# Patient Record
Sex: Male | Born: 1960 | Race: White | Hispanic: No | Marital: Married | Smoking: Never smoker
Health system: Southern US, Community
[De-identification: ages and names within clinical notes are randomized; demographics above are authoritative.]

## PROBLEM LIST (undated history)

## (undated) DIAGNOSIS — N400 Enlarged prostate without lower urinary tract symptoms: Secondary | ICD-10-CM

## (undated) HISTORY — PX: TONSILLECTOMY: SUR1361

## (undated) HISTORY — DX: Benign prostatic hyperplasia without lower urinary tract symptoms: N40.0

---

## 2007-06-03 ENCOUNTER — Ambulatory Visit: Payer: Self-pay | Admitting: Family Medicine

## 2009-10-09 ENCOUNTER — Ambulatory Visit: Payer: Self-pay | Admitting: General Practice

## 2011-06-24 ENCOUNTER — Ambulatory Visit: Payer: Self-pay | Admitting: General Practice

## 2013-12-30 ENCOUNTER — Ambulatory Visit: Payer: Self-pay | Admitting: Gastroenterology

## 2014-01-02 LAB — PATHOLOGY REPORT

## 2015-10-24 ENCOUNTER — Ambulatory Visit: Payer: Self-pay | Admitting: Physician Assistant

## 2015-10-24 VITALS — BP 110/60 | HR 97 | Temp 98.3°F

## 2015-10-24 DIAGNOSIS — H6593 Unspecified nonsuppurative otitis media, bilateral: Secondary | ICD-10-CM

## 2015-10-24 DIAGNOSIS — J069 Acute upper respiratory infection, unspecified: Secondary | ICD-10-CM

## 2015-10-24 MED ORDER — SALINE SPRAY 0.65 % NA SOLN: 2.0000 | NASAL | Status: DC | PRN

## 2015-10-24 MED ORDER — FLUTICASONE PROPIONATE 50 MCG/ACT NA SUSP: 2.0000 | Freq: Every day | NASAL | Status: DC

## 2015-10-24 MED ORDER — PSEUDOEPHEDRINE HCL 60 MG PO TABS: 60.0000 mg | ORAL_TABLET | Freq: Four times a day (QID) | ORAL | Status: AC | PRN

## 2015-10-24 MED ORDER — AMOXICILLIN-POT CLAVULANATE 875-125 MG PO TABS: 1.0000 | ORAL_TABLET | Freq: Two times a day (BID) | ORAL | Status: AC

## 2015-10-24 NOTE — Progress Notes (Signed)
Subjective:    Patient ID: Billy Foster, male    DOB: [birth year], 55 y.o.   MRN: [id number]  HPI Comments: Married caucasian male here for evaluation of sore throat, cough, fatigue started this am.  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 The current episode started today. The problem has been gradually worsening. There has been no fever. The fever has been present for less than 1 day. Associated symptoms include congestion, coughing, rhinorrhea and a sore throat. Pertinent negatives include no abdominal pain, chest pain, diarrhea, dysuria, ear pain, headaches, joint pain, joint swelling, nausea, neck pain, plugged ear sensation, rash, sinus pain, sneezing, swollen glands, vomiting or wheezing. He has tried nothing for the symptoms.  Sore Throat  Associated symptoms include congestion and coughing. Pertinent negatives include no abdominal pain, diarrhea, drooling, ear discharge, ear pain, headaches, plugged ear sensation, neck pain, shortness of breath, stridor, swollen glands, trouble swallowing or vomiting.      Review of Systems  Constitutional: Positive for fatigue. Negative for fever, chills, diaphoresis, activity change, appetite change and unexpected weight change.  HENT: Positive for congestion, postnasal drip, rhinorrhea and sore throat. Negative for dental problem, drooling, ear discharge, ear pain, facial swelling, hearing loss, mouth sores, nosebleeds, sinus pressure, sneezing, tinnitus, trouble swallowing and voice change.   Eyes: Negative for photophobia, pain, discharge, redness, itching and visual disturbance.  Respiratory: Positive for cough. Negative for choking, chest tightness, shortness of breath, wheezing and stridor.   Cardiovascular: Negative for chest pain, palpitations and leg swelling.  Gastrointestinal:  Negative for nausea, vomiting, abdominal pain, diarrhea, constipation, blood in stool and abdominal distention.  Endocrine: Negative for cold intolerance and heat intolerance.  Genitourinary: Negative for dysuria.  Musculoskeletal: Positive for myalgias. Negative for back pain, joint pain, joint swelling, arthralgias, gait problem, neck pain and neck stiffness.  Skin: Negative for color change, pallor, rash and wound.  Allergic/Immunologic: Negative for environmental allergies, food allergies and immunocompromised state.  Neurological: Negative for dizziness, tremors, seizures, syncope, facial asymmetry, speech difficulty, weakness, light-headedness, numbness and headaches.  Hematological: Negative for adenopathy. Does not bruise/bleed easily.  Psychiatric/Behavioral: Negative for behavioral problems, confusion, sleep disturbance and agitation.       Objective:   Physical Exam  Constitutional: He is oriented to person, place, and time. Vital signs are normal. He appears well-developed and well-nourished. He is active and cooperative.  Non-toxic appearance. He does not have a sickly appearance. He appears ill. No distress.  HENT:  Head: Normocephalic and atraumatic.  Right Ear: Hearing, external ear and ear canal normal. A middle ear effusion is present.  Left Ear: Hearing, external ear and ear canal normal. A middle ear effusion is present.  Nose: Mucosal edema and rhinorrhea present. No nose lacerations, sinus tenderness, nasal deformity, septal deviation or nasal septal hematoma. No epistaxis.  No foreign bodies. Right sinus exhibits no maxillary sinus tenderness and no frontal sinus tenderness. Left sinus exhibits no maxillary sinus tenderness and no frontal sinus tenderness.  Mouth/Throat: Uvula is midline and mucous membranes are normal. Mucous membranes are not pale, not dry and not cyanotic. He does not have dentures. No oral lesions. No trismus in the jaw. Normal dentition. No dental  abscesses, uvula swelling, lacerations or dental caries. Posterior oropharyngeal edema and posterior oropharyngeal erythema present. No oropharyngeal  exudate or tonsillar abscesses.  Cobblestoning posterior pharynx; bilateral TMs with air fluid level clear; bilateral nasal turbinates with edema/erythema/clear discharge, nasal congestion  Eyes: Conjunctivae, EOM and lids are normal. Pupils are equal, round, and reactive to light. Right eye exhibits no chemosis, no discharge, no exudate and no hordeolum. No foreign body present in the right eye. Left eye exhibits no chemosis, no discharge, no exudate and no hordeolum. No foreign body present in the left eye. Right conjunctiva is not injected. Right conjunctiva has no hemorrhage. Left conjunctiva is not injected. Left conjunctiva has no hemorrhage. No scleral icterus. Right eye exhibits normal extraocular motion and no nystagmus. Left eye exhibits normal extraocular motion and no nystagmus. Right pupil is round and reactive. Left pupil is round and reactive. Pupils are equal.  Neck: Trachea normal and normal range of motion. Neck supple. No tracheal tenderness, no spinous process tenderness and no muscular tenderness present. No rigidity. No tracheal deviation, no edema, no erythema and normal range of motion present. No thyroid mass and no thyromegaly present.  Cardiovascular: Normal rate, regular rhythm, S1 normal, S2 normal, normal heart sounds and intact distal pulses.  PMI is not displaced.  Exam reveals no gallop and no friction rub.   No murmur heard. Pulmonary/Chest: Effort normal and breath sounds normal. No stridor. No respiratory distress. He has no decreased breath sounds. He has no wheezes. He has no rhonchi. He has no rales.  Occasional nonproductive cough with deep inspiration in exam room  Abdominal: Soft. He exhibits no distension.  Musculoskeletal: Normal range of motion. He exhibits no edema or tenderness.  Lymphadenopathy:       Head  (right side): No submental, no submandibular, no tonsillar, no preauricular, no posterior auricular and no occipital adenopathy present.       Head (left side): No submental, no submandibular, no tonsillar, no preauricular, no posterior auricular and no occipital adenopathy present.    He has no cervical adenopathy.       Right cervical: No superficial cervical, no deep cervical and no posterior cervical adenopathy present.      Left cervical: No superficial cervical, no deep cervical and no posterior cervical adenopathy present.  Neurological: He is alert and oriented to person, place, and time. He displays no atrophy and no tremor. No cranial nerve deficit or sensory deficit. He exhibits normal muscle tone. He displays no seizure activity. Coordination and gait normal. GCS eye subscore is 4. GCS verbal subscore is 5. GCS motor subscore is 6.  Skin: Skin is warm, dry and intact. No abrasion, no bruising, no burn, no ecchymosis, no laceration, no lesion, no petechiae and no rash noted. He is not diaphoretic. No cyanosis or erythema. No pallor. Nails show no clubbing.  Psychiatric: He has a normal mood and affect. His speech is normal and behavior is normal. Judgment and thought content normal. Cognition and memory are normal.  Nursing note and vitals reviewed.         Assessment & Plan:  A- viral upper respiratory infection acute and otitis media effusion bilaterally P-Supportive treatment.   No evidence of invasive bacterial infection, non toxic and well hydrated.  This is most likely self limiting viral infection.  I do not see where any further testing or imaging is necessary at this time.   I will suggest supportive care, rest, good hygiene and encourage the patient to take adequate fluids.  The patient is to return to clinic or EMERGENCY ROOM if symptoms worsen or change  significantly e.g. ear pain, fever, purulent discharge from ears or bleeding. Patient verbalized agreement and understanding  of treatment plan.    Viral illness: no evidence of invasive bacterial infection, non toxic and well hydrated.  This is most likely self limiting viral infection.  I do not see where any further testing or imaging is necessary at this time.   I will suggest supportive care, rest, good hygiene and encourage the patient to take adequate fluids.  Work excuse 48 hours given to patient.    Sudafed 60mg  po q6h prn max 240mg  per 24 hours; flonase 1 spray each nostril BID prn, nasal saline 1-2 sprays each nostril prn q2h, tylenol 1000mg  po QID prn. OTC cough medicine per manufacturer's instructions, humidifier use at home.  If sinus pressure worsening or eat discharge start augmentin 875mg  po BID x 10 days Rx given.  Discussed honey with lemon and salt water gargles for comfort also.  The patient is to return to clinic or EMERGENCY ROOM if symptoms worsen or change significantly e.g. fever, lethargy, SOB, wheezing.   Patient verbalized agreement and understanding of treatment plan and had no further questions at this time.

## 2015-11-06 ENCOUNTER — Ambulatory Visit: Payer: Self-pay | Admitting: Physician Assistant

## 2015-11-06 ENCOUNTER — Encounter: Payer: Self-pay | Admitting: Physician Assistant

## 2015-11-06 VITALS — BP 130/80 | HR 60 | Temp 98.8°F

## 2015-11-06 DIAGNOSIS — J069 Acute upper respiratory infection, unspecified: Secondary | ICD-10-CM

## 2015-11-06 MED ORDER — AZITHROMYCIN 250 MG PO TABS: ORAL_TABLET | ORAL | Status: DC

## 2015-11-06 MED ORDER — METHYLPREDNISOLONE 4 MG PO TBPK: ORAL_TABLET | ORAL | Status: AC

## 2015-11-06 NOTE — Progress Notes (Signed)
S: C/o continued cough and congestion , chest is sore from coughing, r lower ribs hurt and r shoulder hurts; denies fever, chills; cough is dry and hacking; states he started to feel better then a few days later started coughing and felt really tired, finished augmentin and sudafed; denies cardiac type chest pain or sob, v/d, abd pain Remainder ros neg  O: vitals wnl, nad, tms clear, throat injected, neck supple no lymph, lungs c t a, cv rrr, neuro intact  A:  Acute bronchitis   P:  rx medication:  Zpack, medrol dose pack; use otc meds, tylenol or motrin as needed for fever/chills, return if not better in 3 -5 days, return earlier if worsening

## 2015-12-17 ENCOUNTER — Other Ambulatory Visit: Payer: Self-pay

## 2015-12-17 DIAGNOSIS — Z0000 Encounter for general adult medical examination without abnormal findings: Secondary | ICD-10-CM

## 2015-12-17 NOTE — Progress Notes (Signed)
Patient came in to have blood drawn per Dr. Emily Filbert at [facility].  Blood was drawn from the left arm without any incident. Patient wants results sent to Dr. Sabra Heck when they are finalized.

## 2015-12-18 LAB — CMP12+LP+TP+TSH+6AC+PSA+CBC…
A/G RATIO: 2.1 (ref 1.2–2.2)
ALBUMIN: 4.6 g/dL (ref 3.5–5.5)
ALT: 20 IU/L (ref 0–44)
AST: 21 IU/L (ref 0–40)
Alkaline Phosphatase: 64 IU/L (ref 39–117)
BASOS ABS: 0 10*3/uL (ref 0.0–0.2)
BASOS: 0 %
BUN / CREAT RATIO: 14 (ref 9–20)
BUN: 15 mg/dL (ref 6–24)
Bilirubin Total: 0.6 mg/dL (ref 0.0–1.2)
CALCIUM: 9.8 mg/dL (ref 8.7–10.2)
CREATININE: 1.11 mg/dL (ref 0.76–1.27)
Chloride: 102 mmol/L (ref 96–106)
Chol/HDL Ratio: 4.1 ratio units (ref 0.0–5.0)
Cholesterol, Total: 188 mg/dL (ref 100–199)
EOS (ABSOLUTE): 0.1 10*3/uL (ref 0.0–0.4)
EOS: 2 %
ESTIMATED CHD RISK: 0.8 times avg. (ref 0.0–1.0)
FREE THYROXINE INDEX: 1.8 (ref 1.2–4.9)
GFR, EST AFRICAN AMERICAN: 86 mL/min/{1.73_m2} (ref >59)
GFR, EST NON AFRICAN AMERICAN: 74 mL/min/{1.73_m2} (ref >59)
GGT: 17 IU/L (ref 0–65)
GLOBULIN, TOTAL: 2.2 g/dL (ref 1.5–4.5)
Glucose: 96 mg/dL (ref 65–99)
HDL: 46 mg/dL (ref >39)
HEMATOCRIT: 46.1 % (ref 37.5–51.0)
HEMOGLOBIN: 16.1 g/dL (ref 12.6–17.7)
IRON: 123 ug/dL (ref 38–169)
Immature Grans (Abs): 0 10*3/uL (ref 0.0–0.1)
Immature Granulocytes: 0 %
LDH: 158 IU/L (ref 121–224)
LDL Calculated: 128 mg/dL — ABNORMAL HIGH (ref 0–99)
LYMPHS ABS: 2.6 10*3/uL (ref 0.7–3.1)
Lymphs: 45 %
MCH: 32.8 pg (ref 26.6–33.0)
MCHC: 34.9 g/dL (ref 31.5–35.7)
MCV: 94 fL (ref 79–97)
MONOCYTES: 12 %
Monocytes Absolute: 0.7 10*3/uL (ref 0.1–0.9)
NEUTROS ABS: 2.4 10*3/uL (ref 1.4–7.0)
Neutrophils: 41 %
PHOSPHORUS: 4 mg/dL (ref 2.5–4.5)
POTASSIUM: 4.5 mmol/L (ref 3.5–5.2)
Platelets: 189 10*3/uL (ref 150–379)
Prostate Specific Ag, Serum: 4.2 ng/mL — ABNORMAL HIGH (ref 0.0–4.0)
RBC: 4.91 x10E6/uL (ref 4.14–5.80)
RDW: 14.1 % (ref 12.3–15.4)
SODIUM: 144 mmol/L (ref 134–144)
T3 Uptake Ratio: 27 % (ref 24–39)
T4, Total: 6.6 ug/dL (ref 4.5–12.0)
TOTAL PROTEIN: 6.8 g/dL (ref 6.0–8.5)
TRIGLYCERIDES: 70 mg/dL (ref 0–149)
TSH: 1.33 u[IU]/mL (ref 0.450–4.500)
Uric Acid: 6.5 mg/dL (ref 3.7–8.6)
VLDL Cholesterol Cal: 14 mg/dL (ref 5–40)
WBC: 5.9 10*3/uL (ref 3.4–10.8)

## 2015-12-18 LAB — URINALYSIS, ROUTINE W REFLEX MICROSCOPIC
BILIRUBIN UA: NEGATIVE
Glucose, UA: NEGATIVE
KETONES UA: NEGATIVE
LEUKOCYTES UA: NEGATIVE
Nitrite, UA: NEGATIVE
PROTEIN UA: NEGATIVE
RBC, UA: NEGATIVE
SPEC GRAV UA: 1.021 (ref 1.005–1.030)
Urobilinogen, Ur: 0.2 mg/dL (ref 0.2–1.0)
pH, UA: 5.5 (ref 5.0–7.5)

## 2015-12-27 DIAGNOSIS — N401 Enlarged prostate with lower urinary tract symptoms: Secondary | ICD-10-CM | POA: Insufficient documentation

## 2016-11-26 ENCOUNTER — Encounter (INDEPENDENT_AMBULATORY_CARE_PROVIDER_SITE_OTHER): Payer: Self-pay

## 2016-11-26 ENCOUNTER — Ambulatory Visit: Payer: Self-pay | Admitting: Physician Assistant

## 2016-11-26 ENCOUNTER — Encounter: Payer: Self-pay | Admitting: Physician Assistant

## 2016-11-26 VITALS — BP 126/78 | HR 50 | Temp 97.8°F | Ht 70.0 in | Wt 191.0 lb

## 2016-11-26 DIAGNOSIS — Z0000 Encounter for general adult medical examination without abnormal findings: Secondary | ICD-10-CM

## 2016-11-26 DIAGNOSIS — Z0189 Encounter for other specified special examinations: Secondary | ICD-10-CM

## 2016-11-26 DIAGNOSIS — Z008 Encounter for other general examination: Secondary | ICD-10-CM

## 2016-11-26 NOTE — Progress Notes (Signed)
S: pt here for wellness physical and biometrics for insurance purposes, no complaints ros neg. PMH:   bph Social: nonsmoker, no etoh, no drugs, married, 3 kids Fam: father has dm, everyone else healthy or died of old age  O: vitals wnl, nad, ENT wnl, neck supple no lymph, lungs c t a, cv rrr, abd soft nontender bs normal all 4 quads  A: wellness, biometric physical  P: labs today, will fax results to his urologist for psa

## 2016-11-27 LAB — CMP12+LP+TP+TSH+6AC+PSA+CBC…
A/G RATIO: 1.8 (ref 1.2–2.2)
ALT: 33 IU/L (ref 0–44)
AST: 27 IU/L (ref 0–40)
Albumin: 4.3 g/dL (ref 3.5–5.5)
Alkaline Phosphatase: 66 IU/L (ref 39–117)
BASOS: 1 %
BILIRUBIN TOTAL: 1 mg/dL (ref 0.0–1.2)
BUN / CREAT RATIO: 16 (ref 9–20)
BUN: 19 mg/dL (ref 6–24)
Basophils Absolute: 0 10*3/uL (ref 0.0–0.2)
CHLORIDE: 98 mmol/L (ref 96–106)
CHOL/HDL RATIO: 4.8 ratio (ref 0.0–5.0)
CREATININE: 1.19 mg/dL (ref 0.76–1.27)
Calcium: 9.5 mg/dL (ref 8.7–10.2)
Cholesterol, Total: 192 mg/dL (ref 100–199)
EOS (ABSOLUTE): 0.1 10*3/uL (ref 0.0–0.4)
EOS: 2 %
ESTIMATED CHD RISK: 1 times avg. (ref 0.0–1.0)
Free Thyroxine Index: 1.7 (ref 1.2–4.9)
GFR, EST AFRICAN AMERICAN: 78 mL/min/{1.73_m2} (ref >59)
GFR, EST NON AFRICAN AMERICAN: 68 mL/min/{1.73_m2} (ref >59)
GGT: 18 IU/L (ref 0–65)
GLUCOSE: 82 mg/dL (ref 65–99)
Globulin, Total: 2.4 g/dL (ref 1.5–4.5)
HDL: 40 mg/dL (ref >39)
HEMATOCRIT: 43.6 % (ref 37.5–51.0)
Hemoglobin: 15.4 g/dL (ref 13.0–17.7)
IMMATURE GRANS (ABS): 0 10*3/uL (ref 0.0–0.1)
IRON: 169 ug/dL (ref 38–169)
Immature Granulocytes: 0 %
LDH: 175 IU/L (ref 121–224)
LDL Calculated: 136 mg/dL — ABNORMAL HIGH (ref 0–99)
LYMPHS: 47 %
Lymphocytes Absolute: 2.6 10*3/uL (ref 0.7–3.1)
MCH: 32.7 pg (ref 26.6–33.0)
MCHC: 35.3 g/dL (ref 31.5–35.7)
MCV: 93 fL (ref 79–97)
MONOCYTES: 11 %
Monocytes Absolute: 0.6 10*3/uL (ref 0.1–0.9)
NEUTROS ABS: 2.1 10*3/uL (ref 1.4–7.0)
Neutrophils: 39 %
POTASSIUM: 4.4 mmol/L (ref 3.5–5.2)
Phosphorus: 4.3 mg/dL (ref 2.5–4.5)
Platelets: 201 10*3/uL (ref 150–379)
Prostate Specific Ag, Serum: 4 ng/mL (ref 0.0–4.0)
RBC: 4.71 x10E6/uL (ref 4.14–5.80)
RDW: 13.8 % (ref 12.3–15.4)
SODIUM: 141 mmol/L (ref 134–144)
T3 UPTAKE RATIO: 26 % (ref 24–39)
T4, Total: 6.5 ug/dL (ref 4.5–12.0)
TOTAL PROTEIN: 6.7 g/dL (ref 6.0–8.5)
TSH: 1.37 u[IU]/mL (ref 0.450–4.500)
Triglycerides: 82 mg/dL (ref 0–149)
URIC ACID: 6.9 mg/dL (ref 3.7–8.6)
VLDL CHOLESTEROL CAL: 16 mg/dL (ref 5–40)
WBC: 5.5 10*3/uL (ref 3.4–10.8)

## 2016-11-27 LAB — VITAMIN D 25 HYDROXY (VIT D DEFICIENCY, FRACTURES): VIT D 25 HYDROXY: 45.5 ng/mL (ref 30.0–100.0)

## 2016-11-27 LAB — HIV ANTIBODY (ROUTINE TESTING W REFLEX): HIV Screen 4th Generation wRfx: NONREACTIVE

## 2016-11-27 LAB — HEPATITIS C ANTIBODY (REFLEX): HCV Ab: <0.1 s/co ratio (ref 0.0–0.9)

## 2016-11-27 LAB — HCV COMMENT:

## 2016-12-02 ENCOUNTER — Ambulatory Visit: Payer: Self-pay | Admitting: Physician Assistant

## 2017-01-09 DIAGNOSIS — D369 Benign neoplasm, unspecified site: Secondary | ICD-10-CM | POA: Insufficient documentation

## 2017-01-09 DIAGNOSIS — Z9989 Dependence on other enabling machines and devices: Secondary | ICD-10-CM

## 2017-01-09 DIAGNOSIS — G4733 Obstructive sleep apnea (adult) (pediatric): Secondary | ICD-10-CM | POA: Insufficient documentation

## 2017-12-04 ENCOUNTER — Ambulatory Visit: Payer: Self-pay | Admitting: Family Medicine

## 2017-12-04 VITALS — BP 134/81 | HR 56 | Resp 15

## 2017-12-04 DIAGNOSIS — J029 Acute pharyngitis, unspecified: Secondary | ICD-10-CM

## 2017-12-04 LAB — POCT RAPID STREP A (OFFICE): RAPID STREP A SCREEN: NEGATIVE

## 2017-12-04 NOTE — Progress Notes (Signed)
Subjective: Sore throat     Billy Foster is a 57 y.o. male who presents for evaluation of sore throat and mild fatigue for 1 week.  Patient reports onset of mild clear nasal drainage 3-4 days ago and mild nonproductive cough 2 days ago.  Patient has continued working throughout his illness.  Patient reports his son woke up this morning with a fever of 102 and headache but is unsure of the source of this.  Patient reports his predominant symptom is a sore throat.  Patient has been taking Motrin and saltwater gargles for this.  Patient denies fever or chills.  Patient is very active running 2 miles, 5 days a week.  She reports a history of a low resting heart rate in the past.  Denies any difficulty swallowing. Denies rash, nausea, vomiting, diarrhea, shortness of breath, wheezing, chest or back pain, ear pain, difficulty swallowing, confusion, purulent nasal congestion, facial pressure, headache, body aches, severe symptoms, or initial improvement and then worsening symptoms. History of smoking, asthma, COPD: Negative. History of recurrent sinus and/or lung infections: Negative. Medical history: BPH. Antibiotic use in the last 3 months: Negative.  PCP: Dr. Sabra Heck.  Review of Systems Pertinent items noted in HPI and remainder of comprehensive ROS otherwise negative.     Objective:   Physical Exam General: Awake, alert, and oriented. No acute distress. Well developed, hydrated and nourished. Appears stated age. Nontoxic appearance.  HEENT:  PND noted.  Mild erythema to posterior oropharynx.  No edema or exudates of pharynx or tonsils. No erythema or bulging of TM.  Mild erythema/edema to nasal mucosa. Sinuses nontender. Supple neck without adenopathy. Cardiac: Heart rate 56.  Patient reports this is normal for him.  Rhythm normal. No murmurs, gallops, or rubs are auscultated. S1 and S2 are heard and are of normal intensity.  Respiratory: No signs of respiratory distress. Lungs clear. No tachypnea.  Able to speak in full sentences without dyspnea. Nonlabored respirations.  Skin: Skin is warm, dry and intact. Appropriate color for ethnicity. No cyanosis noted.   Diagnostic Results: Rapid strep: Negative.  Assessment:    viral pharyngitis   Plan:    Discussed diagnosis and treatment of viral pharyngitis. Discussed the importance of avoiding unnecessary antibiotic therapy. Suggested symptomatic OTC remedies.  Nasal saline spray for congestion. Follow up as needed.  Discussed red flag symptoms and circumstances with which to seek medical care.

## 2017-12-24 ENCOUNTER — Other Ambulatory Visit: Payer: Self-pay

## 2017-12-24 DIAGNOSIS — Z0000 Encounter for general adult medical examination without abnormal findings: Secondary | ICD-10-CM

## 2017-12-25 LAB — CBC WITH DIFFERENTIAL/PLATELET
BASOS ABS: 0 10*3/uL (ref 0.0–0.2)
Basos: 0 %
EOS (ABSOLUTE): 0.1 10*3/uL (ref 0.0–0.4)
Eos: 2 %
HEMOGLOBIN: 15.7 g/dL (ref 13.0–17.7)
Hematocrit: 47 % (ref 37.5–51.0)
IMMATURE GRANS (ABS): 0 10*3/uL (ref 0.0–0.1)
IMMATURE GRANULOCYTES: 0 %
LYMPHS: 49 %
Lymphocytes Absolute: 2.7 10*3/uL (ref 0.7–3.1)
MCH: 32.3 pg (ref 26.6–33.0)
MCHC: 33.4 g/dL (ref 31.5–35.7)
MCV: 97 fL (ref 79–97)
MONOCYTES: 11 %
Monocytes Absolute: 0.6 10*3/uL (ref 0.1–0.9)
NEUTROS PCT: 38 %
Neutrophils Absolute: 2.1 10*3/uL (ref 1.4–7.0)
Platelets: 204 10*3/uL (ref 150–379)
RBC: 4.86 x10E6/uL (ref 4.14–5.80)
RDW: 14 % (ref 12.3–15.4)
WBC: 5.5 10*3/uL (ref 3.4–10.8)

## 2017-12-25 LAB — COMPREHENSIVE METABOLIC PANEL
ALT: 28 IU/L (ref 0–44)
AST: 25 IU/L (ref 0–40)
Albumin/Globulin Ratio: 2.1 (ref 1.2–2.2)
Albumin: 4.6 g/dL (ref 3.5–5.5)
Alkaline Phosphatase: 75 IU/L (ref 39–117)
BUN / CREAT RATIO: 20 (ref 9–20)
BUN: 22 mg/dL (ref 6–24)
Bilirubin Total: 0.5 mg/dL (ref 0.0–1.2)
CALCIUM: 9.6 mg/dL (ref 8.7–10.2)
CO2: 24 mmol/L (ref 20–29)
CREATININE: 1.11 mg/dL (ref 0.76–1.27)
Chloride: 106 mmol/L (ref 96–106)
GFR calc Af Amer: 85 mL/min/{1.73_m2} (ref >59)
GFR, EST NON AFRICAN AMERICAN: 73 mL/min/{1.73_m2} (ref >59)
GLOBULIN, TOTAL: 2.2 g/dL (ref 1.5–4.5)
GLUCOSE: 98 mg/dL (ref 65–99)
Potassium: 4.5 mmol/L (ref 3.5–5.2)
Sodium: 147 mmol/L — ABNORMAL HIGH (ref 134–144)
Total Protein: 6.8 g/dL (ref 6.0–8.5)

## 2017-12-25 LAB — LIPID PANEL WITH LDL/HDL RATIO
CHOLESTEROL TOTAL: 188 mg/dL (ref 100–199)
HDL: 45 mg/dL (ref >39)
LDL CALC: 132 mg/dL — AB (ref 0–99)
LDL/HDL RATIO: 2.9 ratio (ref 0.0–3.6)
Triglycerides: 54 mg/dL (ref 0–149)
VLDL CHOLESTEROL CAL: 11 mg/dL (ref 5–40)

## 2017-12-25 LAB — URINALYSIS, ROUTINE W REFLEX MICROSCOPIC
Bilirubin, UA: NEGATIVE
Glucose, UA: NEGATIVE
KETONES UA: NEGATIVE
Leukocytes, UA: NEGATIVE
NITRITE UA: NEGATIVE
Protein, UA: NEGATIVE
RBC UA: NEGATIVE
Specific Gravity, UA: >=1.03 — AB (ref 1.005–1.030)
UUROB: 0.2 mg/dL (ref 0.2–1.0)
pH, UA: 6.5 (ref 5.0–7.5)

## 2017-12-25 LAB — VITAMIN B12: VITAMIN B 12: 1122 pg/mL (ref 232–1245)

## 2017-12-25 LAB — PSA, TOTAL AND FREE
PSA, Free Pct: 13.1 %
PSA, Free: 0.8 ng/mL
Prostate Specific Ag, Serum: 6.1 ng/mL — ABNORMAL HIGH (ref 0.0–4.0)

## 2017-12-25 LAB — HGB A1C W/O EAG: HEMOGLOBIN A1C: 5.5 % (ref 4.8–5.6)

## 2017-12-25 LAB — TSH: TSH: 1.35 u[IU]/mL (ref 0.450–4.500)

## 2017-12-28 ENCOUNTER — Other Ambulatory Visit (HOSPITAL_COMMUNITY): Payer: Self-pay | Admitting: Urology

## 2017-12-28 DIAGNOSIS — R972 Elevated prostate specific antigen [PSA]: Secondary | ICD-10-CM

## 2018-01-08 ENCOUNTER — Ambulatory Visit (HOSPITAL_COMMUNITY)
Admission: RE | Admit: 2018-01-08 | Discharge: 2018-01-08 | Disposition: A | Discharge status: Home / Self Care | Payer: Managed Care, Other (non HMO) | Source: Ambulatory Visit | Attending: Urology | Admitting: Urology

## 2018-01-08 DIAGNOSIS — R972 Elevated prostate specific antigen [PSA]: Secondary | ICD-10-CM | POA: Diagnosis not present

## 2018-01-08 MED ORDER — GADOBENATE DIMEGLUMINE 529 MG/ML IV SOLN
20.0000 mL | Freq: Once | INTRAVENOUS | Status: AC | PRN
  Administered 2018-01-08: 19 mL via INTRAVENOUS

## 2018-01-08 MED ORDER — LIDOCAINE HCL URETHRAL/MUCOSAL 2 % EX GEL
CUTANEOUS | Status: AC
  Filled 2018-01-08: qty 30

## 2018-06-15 ENCOUNTER — Other Ambulatory Visit: Payer: Self-pay

## 2018-06-15 DIAGNOSIS — R972 Elevated prostate specific antigen [PSA]: Secondary | ICD-10-CM

## 2018-06-16 LAB — PSA, TOTAL AND FREE
PSA, Free Pct: 12.5 %
PSA, Free: 0.6 ng/mL
Prostate Specific Ag, Serum: 4.8 ng/mL — ABNORMAL HIGH (ref 0.0–4.0)

## 2018-06-16 NOTE — Progress Notes (Signed)
Routed lab results to the ordering Provider Maryan Puls MD 06/16/18 - [place]

## 2018-06-18 NOTE — Addendum Note (Signed)
Addended by: Judie Petit on: 06/18/2018 02:16 PM   Modules accepted: Level of Service

## 2018-07-14 ENCOUNTER — Ambulatory Visit: Payer: Self-pay | Admitting: Emergency Medicine

## 2018-07-14 VITALS — BP 116/64 | HR 52 | Temp 98.4°F | Resp 14 | Ht 70.0 in | Wt 198.0 lb

## 2018-07-14 DIAGNOSIS — J040 Acute laryngitis: Secondary | ICD-10-CM

## 2018-07-14 DIAGNOSIS — J029 Acute pharyngitis, unspecified: Secondary | ICD-10-CM

## 2018-07-14 LAB — POCT RAPID STREP A (OFFICE): RAPID STREP A SCREEN: NEGATIVE

## 2018-07-14 MED ORDER — BENZONATATE 100 MG PO CAPS: 100.0000 mg | ORAL_CAPSULE | Freq: Three times a day (TID) | ORAL | 0 refills | Status: DC | PRN

## 2018-07-14 MED ORDER — FLUTICASONE PROPIONATE 50 MCG/ACT NA SUSP: 2.0000 | Freq: Every day | NASAL | 6 refills | Status: DC

## 2018-07-14 NOTE — Patient Instructions (Signed)
  Take medication as instructed. Please rest your voice. Return to clinic with worsening symptoms. We will call you with culture results.    Laryngitis Laryngitis is swelling (inflammation) of your vocal cords. This causes hoarseness, coughing, loss of voice, sore throat, or a dry throat. When your vocal cords are inflamed, your voice sounds different. Laryngitis can be temporary (acute) or long-term (chronic). Most cases of acute laryngitis improve with time. Chronic laryngitis is laryngitis that lasts for more than three weeks. Follow these instructions at home:  Drink enough fluid to keep your pee (urine) clear or pale yellow.  Breathe in moist air. Use a humidifier if you live in a dry climate.  Take medicines only as told by your doctor.  Do not smoke cigarettes or electronic cigarettes. If you need help quitting, ask your doctor.  Talk as little as possible. Also avoid whispering, which can cause vocal strain.  Write instead of talking. Do this until your voice is back to normal. Contact a doctor if:  You have a fever.  Your pain is worse.  You have trouble swallowing. Get help right away if:  You cough up blood.  You have trouble breathing. This information is not intended to replace advice given to you by your health care provider. Make sure you discuss any questions you have with your health care provider. Document Released: 07/31/2011 Document Revised: 01/17/2016 Document Reviewed: 01/24/2014 Elsevier Interactive Patient Education  [employer].

## 2018-07-14 NOTE — Progress Notes (Signed)
Subjective: Patient presents with onset Friday evening which is 5 days ago of a scratchy throat.  Over the weekend he developed clear nasal drainage which has turned to a yellowish discolored drainage.  Last 2 days he has had difficulty with his voice.  He has had a mild sore throat.  He states he may be starting to have a cough today but cough has not been a main issue for him.  He did have a flu shot 2 weeks ago.  He has not had any fever or chills.  He does not smoke or vape. Past medical history Positive for obstructive sleep apnea. History of BPH. Review of systems: HEENT as in the present illness. Chest no shortness of breath or cough. Cardiac no chest pain or palpitations. Abdomen negative GI symptoms. Objective: Alert cooperative in no distress. TMs clear. Nose normal. Throat mild redness of the uvula and posterior pharynx. Neck supple without adenopathy. Voice definitely hoarse. Chest clear to auscultation. Heart slow rhythm no murmurs. Assessment: Patient presents with upper respiratory infection with developing laryngitis.  He has a minimal sore throat.  We will go ahead and do a strep screen and culture.  He will be treated symptomatically in that he is that they 5 of illness. Plan: 1 Flonase 2 puffs once daily. 2 Tessalon Perles as needed for cough. 3 Voice rest. 4 Return to clinic with worsening symptoms. 5 Discussed with pharmacist use of Sudafed versus loratadine for congestion.. 6 Check strep test.

## 2018-07-15 LAB — STREP A DNA PROBE: Strep Gp A Direct, DNA Probe: NEGATIVE

## 2018-07-15 NOTE — Progress Notes (Addendum)
Called Patient and spoke with him about his negative Strep Culture. He stated he is not fully better but is feeling much better since his visit with [place] and after taking the medicine prescribed. Patient was notified to continue the treatment plan set for him at his visit and told to contact the clinic if symptoms worsen or to follow up with his PCP. Patient gave verbal understanding. Dirk Dress Malaak Stach CMA

## 2018-09-02 ENCOUNTER — Ambulatory Visit: Payer: Self-pay

## 2018-09-03 ENCOUNTER — Ambulatory Visit: Payer: Self-pay | Admitting: Emergency Medicine

## 2018-09-03 VITALS — BP 118/76 | HR 53 | Temp 98.2°F | Resp 14

## 2018-09-03 DIAGNOSIS — R21 Rash and other nonspecific skin eruption: Secondary | ICD-10-CM

## 2018-09-03 MED ORDER — VALACYCLOVIR HCL 1 G PO TABS: 1000.0000 mg | ORAL_TABLET | Freq: Two times a day (BID) | ORAL | 0 refills | Status: DC

## 2018-09-03 NOTE — Progress Notes (Signed)
Subjective: Patient enters with a 2-week history of a rash involving the left upper lid and lower lid.  He is not sure but may have some mild redness in the conjunctive of the left eye.  He has had no visual discomfort.  He has tried Vaseline and hydrocortisone cream.  He does use a CPAP.  He has never had this issue before.  He has no history of herpes. Review of systems: Noncontributory as relates to the present illness. Objective: Visual acuity 20/20 OS and OD. The scalp is normal.  There are 2 to 3 mm red bumps involving the upper and lower lid and lateral canthus. There is a single 3 mm pimple-like area in the preauricular area.  There are no palpable nodes.  Pupils are equal and reactive to light there is a small pterygium present medially.  The disc margin is sharp.  Extraocular muscle movements are normal.  There is a small 1/2 x 1/2 cm left preauricular node Assessment: Patient has multiple red bumps around the upper and lower lid of the left eye.  These could be herpetic.  We did not have culture transport media.  I advised him it would be reasonable to try a trial of Valtrex twice a day for 10 days.  If this does not clear his rash he should see a dermatologist for their evaluation. Plan: Valtrex 1 g twice a day for 10 days. Follow-up with dermatologist. Vaseline only to apply.  As needed. Patient was advised to follow-up with an eye physician if he developed any eye pain or discomfort over the cornea or developed any photophobia.

## 2018-09-03 NOTE — Patient Instructions (Addendum)
Take medication twice a day. If your rash is persistent please see a dermatologist to be sure that this is not a heliotrope rash.  This rash could also be related to eczema but usually this involves both eyelids You can apply Vaseline twice a day. Patient was advised verbally during the office visit to follow-up with the eye doctor he if he developed any eye pain or difficulty with vision.

## 2018-09-14 IMAGING — MR MR PROSTATE WO/W CM
23 of 56 series · 23 of 56 positions shown · IV contrast (yes)
Comparison: None.

CLINICAL DATA: Elevated PSA.  Previous negative prostate biopsy.

EXAM:
MR PROSTATE WITHOUT AND WITH CONTRAST
TECHNIQUE: Multiplanar multisequence MRI images were obtained of the pelvis
centered about the prostate. Pre and post contrast images were
obtained.
CONTRAST:  19mL MULTIHANCE GADOBENATE DIMEGLUMINE 529 MG/ML IV SOLN

[Series 3: bSSFP fat-sat · axial · 6.0mm · 0.86mm/px · 1 of 44 slices shown]
[im 1/44]
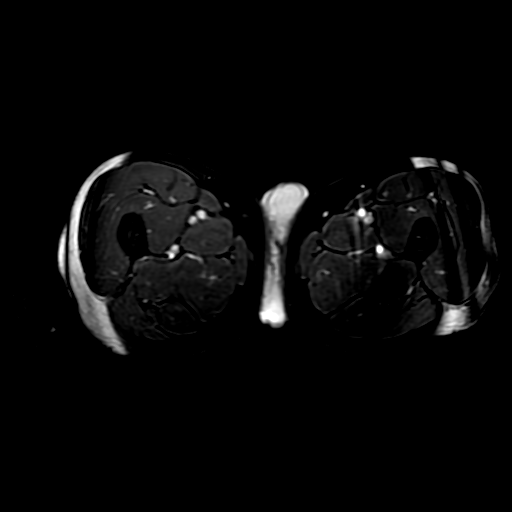

[Series 4: T1 · axial · 6.0mm · 0.86mm/px · 1 of 44 slices shown (1 of 3)]
[im 1/44]
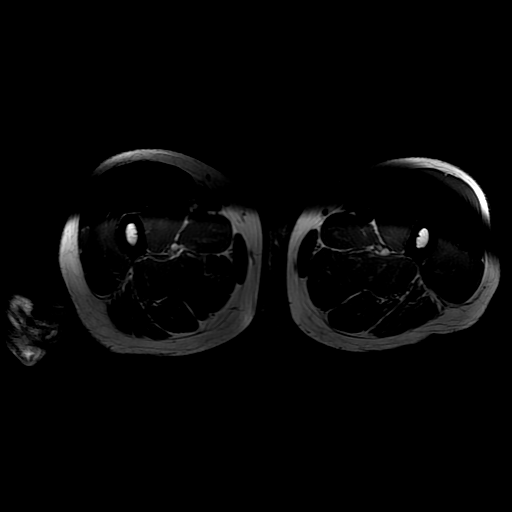

[Series 5: T2 · axial · 3.0mm · 0.29mm/px · 1 of 24 slices shown (1 of 4)]
[im 1/24]
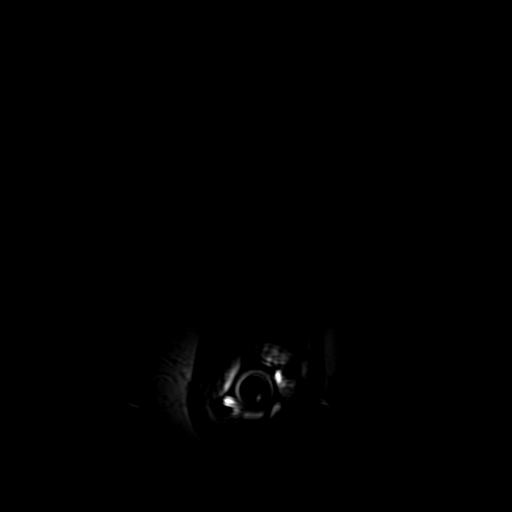

[Series 6: T1 · axial · 3.0mm · 0.29mm/px · 1 of 24 slices shown (2 of 3)]
[im 1/24]
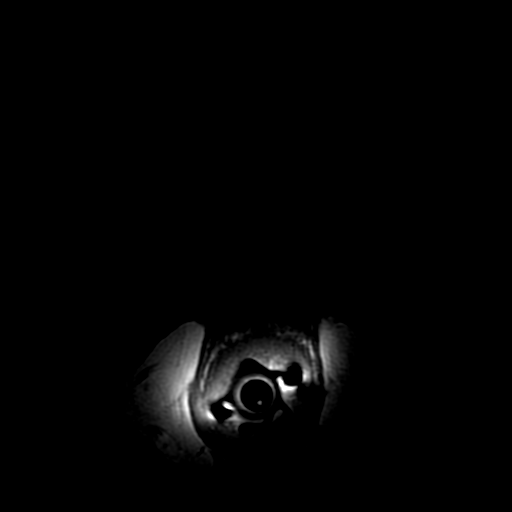

[Series 7: T2 · axial · 1.8mm · 0.47mm/px · 1 of 156 slices shown (2 of 4)]
[im 1/156]
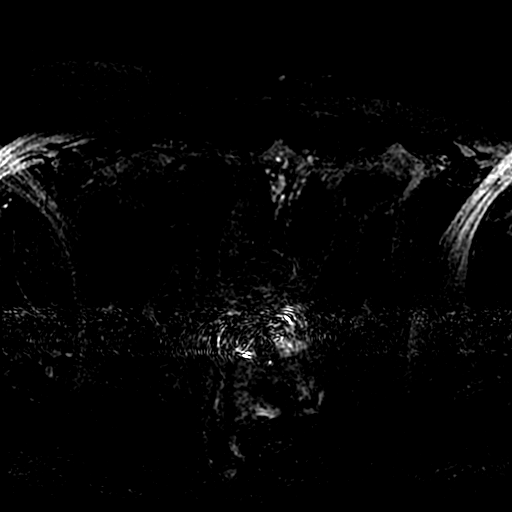

[Series 8: T1 · axial · 3.0mm · 0.29mm/px · 1 of 24 slices shown (3 of 3)]
[im 1/24]
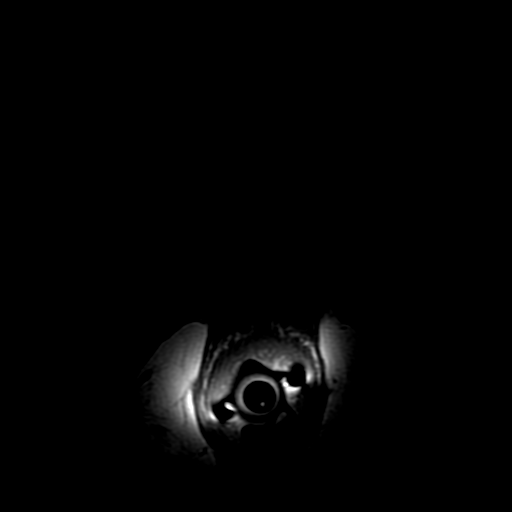

[Series 9: T2 · sagittal · 4.0mm · 0.29mm/px · 1 of 24 slices shown (3 of 4)]
[im 1/24]
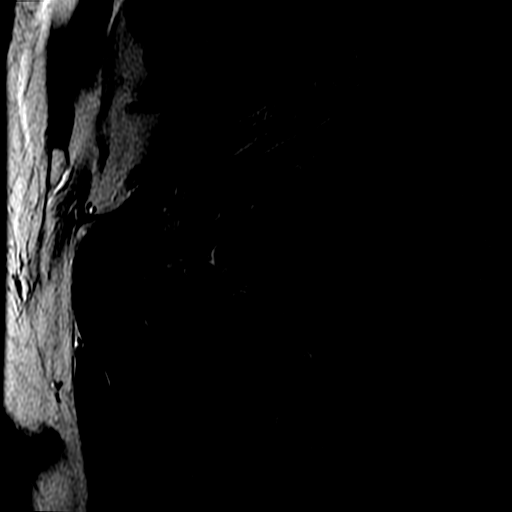

[Series 10: T2 · coronal · 4.0mm · 0.29mm/px · 1 of 24 slices shown (4 of 4)]
[im 1/24]
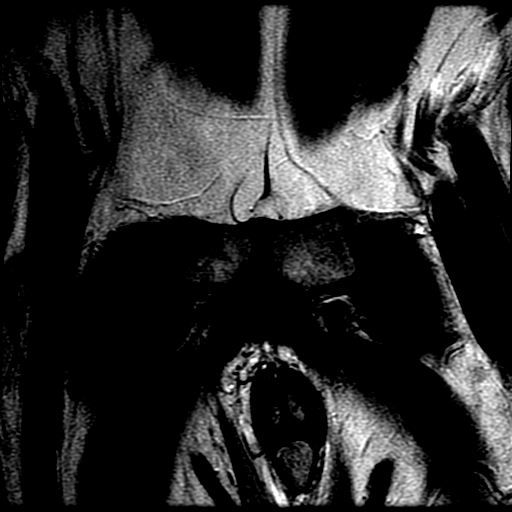

[Series 11: DWI · axial · 3.0mm · 0.59mm/px · 1 of 45 slices shown (1 of 6)]
[im 1/45]
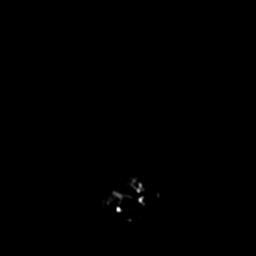

[Series 12: DWI · axial · 3.0mm · 0.59mm/px · 1 of 46 slices shown (2 of 6)]
[im 1/46]
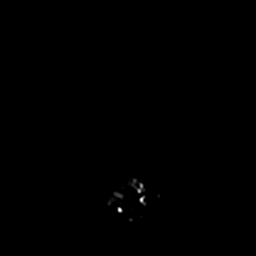

[Series 13: DWI · axial · 3.0mm · 0.59mm/px · 1 of 47 slices shown (3 of 6)]
[im 1/47]
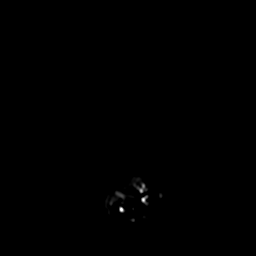

[Series 700: reformatted · coronal · 2.0mm · 0.39mm/px · 1 of 69 slices shown (1 of 2)]
[im 1/69]
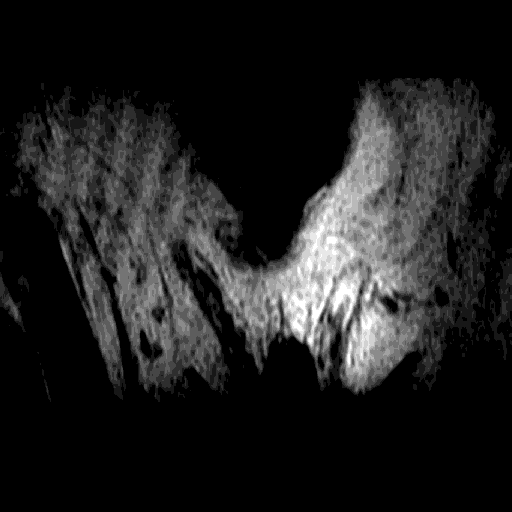

[Series 701: reformatted · sagittal · 2.0mm · 0.43mm/px · 1 of 63 slices shown (2 of 2)]
[im 1/63]
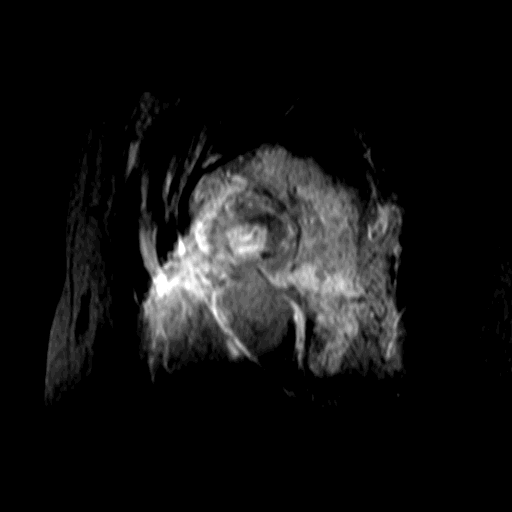

[Series 1100: DWI · axial · 3.0mm · 0.59mm/px · 1 of 24 slices shown (4 of 6)]
[im 1/24]
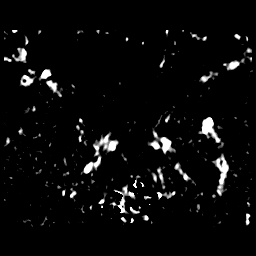

[Series 1200: DWI · axial · 3.0mm · 0.59mm/px · 1 of 24 slices shown (5 of 6)]
[im 1/24]
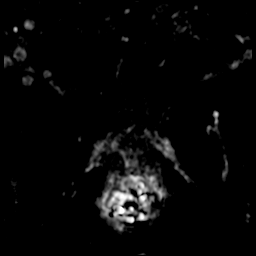

[Series 1300: DWI · axial · 3.0mm · 0.59mm/px · 1 of 24 slices shown (6 of 6)]
[im 1/24]
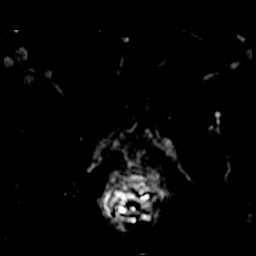

[((id)/(id)/1)-((id)/(id)/1) · axial · 3.0mm · 0.43mm/px · 1 of 75 slices shown (1 of 7)]
[im 1/75]
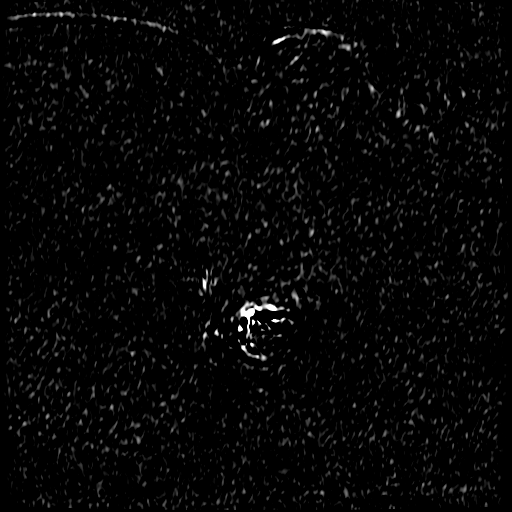

[((id)/(id)/1)-((id)/(id)/1) · axial · 3.0mm · 0.43mm/px · 1 of 76 slices shown (2 of 7)]
[im 1/76]
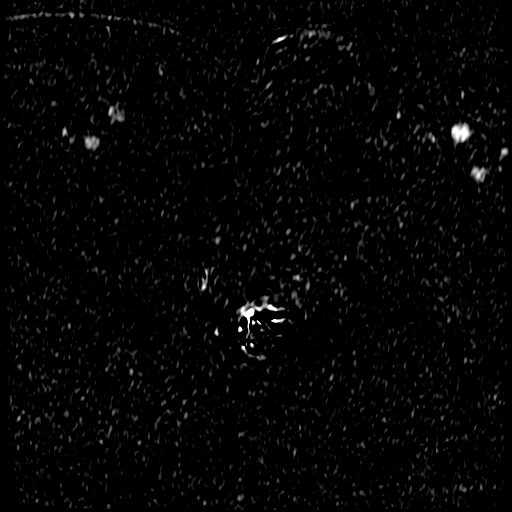

[((id)/(id)/1)-((id)/(id)/1) · axial · 3.0mm · 0.43mm/px · 1 of 76 slices shown (3 of 7)]
[im 1/76]
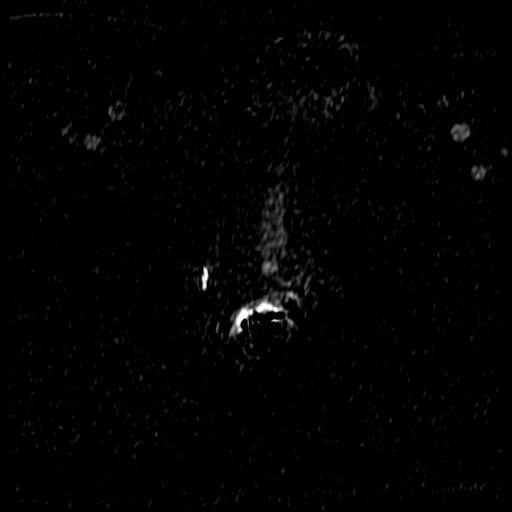

[((id)/(id)/1)-((id)/(id)/1) · axial · 3.0mm · 0.43mm/px · 1 of 76 slices shown (4 of 7)]
[im 1/76]
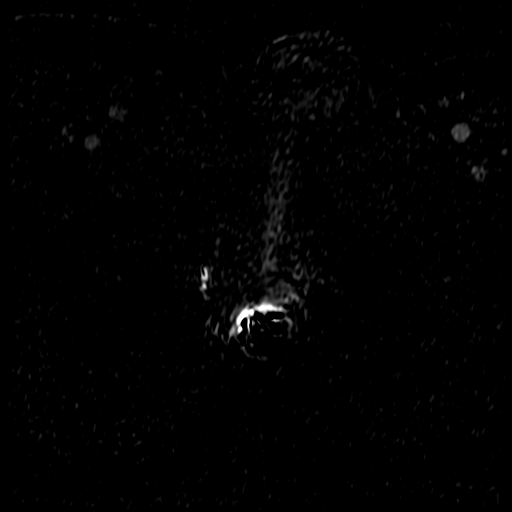

[((id)/(id)/1)-((id)/(id)/1) · axial · 3.0mm · 0.43mm/px · 1 of 76 slices shown (5 of 7)]
[im 1/76]
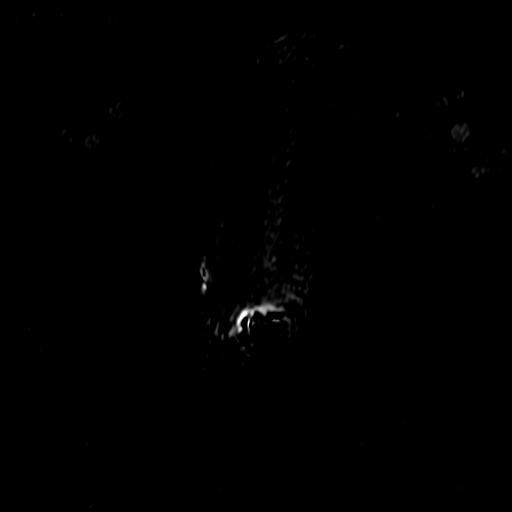

[((id)/(id)/1)-((id)/(id)/1) · axial · 3.0mm · 0.43mm/px · 1 of 76 slices shown (6 of 7)]
[im 1/76]
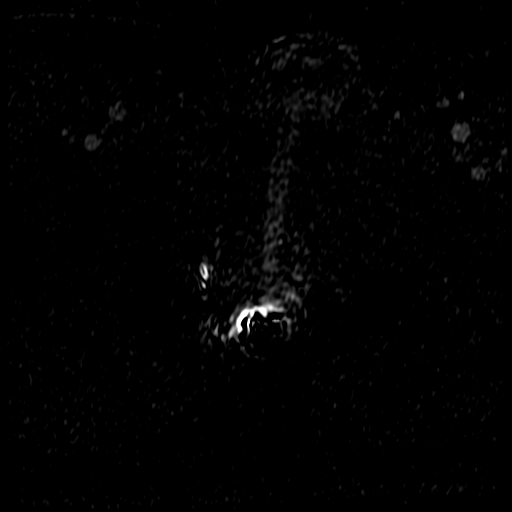

[((id)/(id)/1)-((id)/(id)/1) · axial · 3.0mm · 0.43mm/px · 1 of 75 slices shown (7 of 7)]
[im 1/75]
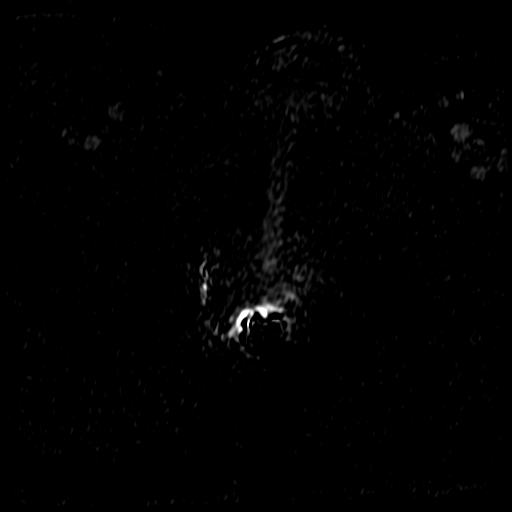

[23 of 56 positions shown; findings below may reference images not displayed]

FINDINGS: Prostate:

-- Peripheral Zone: No focal abnormality seen on ADC and DWI
sequences. No suspicious T2 hypointense nodules identified.

-- Transition/Central Zone: Several small BPH nodules. No suspicious
T2 hypointense nodules identified.

-- Volume:  4.8 x 3.3 x 4.7 cm (volume = 39 cm^3)

Transcapsular spread:  Absent

Seminal vesicle involvement:  Absent

Neurovascular bundle involvement:  Absent

Pelvic adenopathy: None visualized

Bone metastasis: None visualized

Other:  None
IMPRESSION: No radiographic evidence of high-grade prostate carcinoma. PI-RADS
1: Very Low (clinically significant cancer is highly unlikely to be
present)

## 2018-10-07 DIAGNOSIS — D239 Other benign neoplasm of skin, unspecified: Secondary | ICD-10-CM

## 2018-10-07 HISTORY — DX: Other benign neoplasm of skin, unspecified: D23.9

## 2018-12-07 ENCOUNTER — Other Ambulatory Visit: Payer: Self-pay

## 2018-12-07 ENCOUNTER — Other Ambulatory Visit: Payer: Managed Care, Other (non HMO)

## 2018-12-07 DIAGNOSIS — R972 Elevated prostate specific antigen [PSA]: Secondary | ICD-10-CM | POA: Diagnosis not present

## 2018-12-07 DIAGNOSIS — N401 Enlarged prostate with lower urinary tract symptoms: Secondary | ICD-10-CM

## 2018-12-07 NOTE — Progress Notes (Signed)
Send results to Dr. Ananias Pilgrim clinic with Dr. Yves Dill

## 2018-12-08 LAB — PSA: Prostate Specific Ag, Serum: 4.2 ng/mL — ABNORMAL HIGH (ref 0.0–4.0)

## 2018-12-15 NOTE — Addendum Note (Signed)
Addended by: Doreen Beam on: 12/15/2018 11:01 AM   Modules accepted: Level of Service

## 2018-12-16 NOTE — Addendum Note (Signed)
Addended by: Judie Petit on: 12/16/2018 12:05 PM   Modules accepted: Level of Service

## 2019-01-27 ENCOUNTER — Ambulatory Visit: Payer: Managed Care, Other (non HMO) | Admitting: Adult Health

## 2019-01-27 ENCOUNTER — Other Ambulatory Visit: Payer: Managed Care, Other (non HMO)

## 2019-01-27 ENCOUNTER — Encounter: Payer: Self-pay | Admitting: Adult Health

## 2019-01-27 ENCOUNTER — Other Ambulatory Visit: Payer: Self-pay

## 2019-01-27 VITALS — BP 130/70 | HR 56 | Temp 98.4°F | Resp 16 | Ht 70.0 in | Wt 195.0 lb

## 2019-01-27 DIAGNOSIS — Z0000 Encounter for general adult medical examination without abnormal findings: Secondary | ICD-10-CM

## 2019-01-27 DIAGNOSIS — R001 Bradycardia, unspecified: Secondary | ICD-10-CM | POA: Diagnosis not present

## 2019-01-27 DIAGNOSIS — Z0189 Encounter for other specified special examinations: Secondary | ICD-10-CM

## 2019-01-27 DIAGNOSIS — Z008 Encounter for other general examination: Secondary | ICD-10-CM

## 2019-01-27 NOTE — Patient Instructions (Signed)
Follow up with primary care as needed for chronic and maintenance health care- can be seen in this employee clinic for acute care.    I will have the office call you on your glucose and cholesterol results when they return if you have not heard within 1 week please call the office.  This biometric physical is a brief physical and the only labs done are glucose and your lipid panel(cholesterol) and is  not a substitute for seeing a primary care provider for a complete annual physical. Please see a primary care physician for routine health maintenance, labs and full physical at least yearly and follow up as recommended by your provider. Provider also recommends if you do not have a primary care provider for patient to establish care as soon as possible .Patient may chose provider of choice. Also gave the Export at [phone] or web site at [email or web address] to help assist with finding a primary care doctor.  Patient verbalizes understanding that his office is acute care only and not a substitute for a primary care or for the management of chronic conditions.     Health Maintenance, Male A healthy lifestyle and preventive care is important for your health and wellness. Ask your health care provider about what schedule of regular examinations is right for you. What should I know about weight and diet? Eat a Healthy Diet  Eat plenty of vegetables, fruits, whole grains, low-fat dairy products, and lean protein.  Do not eat a lot of foods high in solid fats, added sugars, or salt.  Maintain a Healthy Weight Regular exercise can help you achieve or maintain a healthy weight. You should:  Do at least 150 minutes of exercise each week. The exercise should increase your heart rate and make you sweat (moderate-intensity exercise).  Do strength-training exercises at least twice a week. Watch Your Levels of Cholesterol and Blood Lipids  Have your blood tested  for lipids and cholesterol every 5 years starting at 58 years of age. If you are at high risk for heart disease, you should start having your blood tested when you are 58 years old. You may need to have your cholesterol levels checked more often if: ? Your lipid or cholesterol levels are high. ? You are older than 58 years of age. ? You are at high risk for heart disease. What should I know about cancer screening? Many types of cancers can be detected early and may often be prevented. Lung Cancer  You should be screened every year for lung cancer if: ? You are a current smoker who has smoked for at least 30 years. ? You are a former smoker who has quit within the past 15 years.  Talk to your health care provider about your screening options, when you should start screening, and how often you should be screened. Colorectal Cancer  Routine colorectal cancer screening usually begins at 58 years of age and should be repeated every 5-10 years until you are 58 years old. You may need to be screened more often if early forms of precancerous polyps or small growths are found. Your health care provider may recommend screening at an earlier age if you have risk factors for colon cancer.  Your health care provider may recommend using home test kits to check for hidden blood in the stool.  A small camera at the end of a tube can be used to examine your colon (sigmoidoscopy or colonoscopy). This checks  for the earliest forms of colorectal cancer. Prostate and Testicular Cancer  Depending on your age and overall health, your health care provider may do certain tests to screen for prostate and testicular cancer.  Talk to your health care provider about any symptoms or concerns you have about testicular or prostate cancer. Skin Cancer  Check your skin from head to toe regularly.  Tell your health care provider about any new moles or changes in moles, especially if: ? There is a change in a mole's size,  shape, or color. ? You have a mole that is larger than a pencil eraser.  Always use sunscreen. Apply sunscreen liberally and repeat throughout the day.  Protect yourself by wearing long sleeves, pants, a wide-brimmed hat, and sunglasses when outside. What should I know about heart disease, diabetes, and high blood pressure?  If you are 25-20 years of age, have your blood pressure checked every 3-5 years. If you are 51 years of age or older, have your blood pressure checked every year. You should have your blood pressure measured twice-once when you are at a hospital or clinic, and once when you are not at a hospital or clinic. Record the average of the two measurements. To check your blood pressure when you are not at a hospital or clinic, you can use: ? An automated blood pressure machine at a pharmacy. ? A home blood pressure monitor.  Talk to your health care provider about your target blood pressure.  If you are between 78-51 years old, ask your health care provider if you should take aspirin to prevent heart disease.  Have regular diabetes screenings by checking your fasting blood sugar level. ? If you are at a normal weight and have a low risk for diabetes, have this test once every three years after the age of 73. ? If you are overweight and have a high risk for diabetes, consider being tested at a younger age or more often.  A one-time screening for abdominal aortic aneurysm (AAA) by ultrasound is recommended for men aged 2-75 years who are current or former smokers. What should I know about preventing infection? Hepatitis B If you have a higher risk for hepatitis B, you should be screened for this virus. Talk with your health care provider to find out if you are at risk for hepatitis B infection. Hepatitis C Blood testing is recommended for:  Everyone born from [birth year] through 1965.  Anyone with known risk factors for hepatitis C. Sexually Transmitted Diseases (STDs)  You  should be screened each year for STDs including gonorrhea and chlamydia if: ? You are sexually active and are younger than 58 years of age. ? You are older than 58 years of age and your health care provider tells you that you are at risk for this type of infection. ? Your sexual activity has changed since you were last screened and you are at an increased risk for chlamydia or gonorrhea. Ask your health care provider if you are at risk.  Talk with your health care provider about whether you are at high risk of being infected with HIV. Your health care provider may recommend a prescription medicine to help prevent HIV infection. What else can I do?  Schedule regular health, dental, and eye exams.  Stay current with your vaccines (immunizations).  Do not use any tobacco products, such as cigarettes, chewing tobacco, and e-cigarettes. If you need help quitting, ask your health care provider.  Limit alcohol intake to no  more than 2 drinks per day. One drink equals 12 ounces of beer, 5 ounces of wine, or 1 ounces of hard liquor.  Do not use street drugs.  Do not share needles.  Ask your health care provider for help if you need support or information about quitting drugs.  Tell your health care provider if you often feel depressed.  Tell your health care provider if you have ever been abused or do not feel safe at home. This information is not intended to replace advice given to you by your health care provider. Make sure you discuss any questions you have with your health care provider. Document Released: 02/07/2008 Document Revised: 04/09/2016 Document Reviewed: 05/15/2015 Elsevier Interactive Patient Education  2019 [employer].

## 2019-01-27 NOTE — Progress Notes (Signed)
Dr. [facility]ocker urology. [place].  bilkes 25 mins every morning.    Billy Foster DOB: 58 y.o. 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   He reports long standing history of bradycardia that has been evaluated by his primary care Rusty Aus, MD/ he declines any further work up or EKG at this visit.   Denies any lightheadedness or dizziness.   Patient  denies any fever, body aches,chills, rash, chest pain, shortness of breath, nausea, vomiting, or diarrhea.    Objective: Blood pressure 130/70, pulse (!) 53, temperature 98.4 F (36.9 C), temperature source Oral, resp. rate 16, height 5\' 10"  (1.778 m), weight 195 lb (88.5 kg), SpO2 98 %. 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 Discussed with patient that today's visit here is a limited biometric screening visit (not a comprehensive exam or management of any chronic problems) Discussed some health issues, including healthy eating habits and exercise. Encouraged to follow-up with PCP for annual comprehensive preventive and wellness care (and if applicable, any chronic issues). Questions invited and answered.  It is recommended that he follow up with his primary care for further work up if needed regarding longstanding   Advised patient call the office or your primary care doctor for an appointment if no improvement within 72 hours or if any symptoms change or worsen at any time  Advised ER or urgent Care if after hours or on weekend. Call 911 for emergency  symptoms at any time.Patinet verbalized understanding of all instructions given/reviewed and treatment plan and has no further questions or concerns at this time.    Patient verbalized understanding of all instructions given and denies any further questions at this time.

## 2019-01-28 LAB — LIPID PANEL WITH LDL/HDL RATIO
Cholesterol, Total: 197 mg/dL (ref 100–199)
HDL: 46 mg/dL (ref >39)
LDL Calculated: 136 mg/dL — ABNORMAL HIGH (ref 0–99)
LDl/HDL Ratio: 3 ratio (ref 0.0–3.6)
Triglycerides: 76 mg/dL (ref 0–149)
VLDL Cholesterol Cal: 15 mg/dL (ref 5–40)

## 2019-01-28 LAB — COMPREHENSIVE METABOLIC PANEL
ALT: 34 IU/L (ref 0–44)
AST: 26 IU/L (ref 0–40)
Albumin/Globulin Ratio: 2.5 — ABNORMAL HIGH (ref 1.2–2.2)
Albumin: 4.7 g/dL (ref 3.8–4.9)
Alkaline Phosphatase: 66 IU/L (ref 39–117)
BUN/Creatinine Ratio: 18 (ref 9–20)
BUN: 19 mg/dL (ref 6–24)
Bilirubin Total: 0.5 mg/dL (ref 0.0–1.2)
CO2: 24 mmol/L (ref 20–29)
Calcium: 9.9 mg/dL (ref 8.7–10.2)
Chloride: 103 mmol/L (ref 96–106)
Creatinine, Ser: 1.04 mg/dL (ref 0.76–1.27)
GFR calc Af Amer: 91 mL/min/{1.73_m2} (ref >59)
GFR calc non Af Amer: 79 mL/min/{1.73_m2} (ref >59)
Globulin, Total: 1.9 g/dL (ref 1.5–4.5)
Glucose: 90 mg/dL (ref 65–99)
Potassium: 4.3 mmol/L (ref 3.5–5.2)
Sodium: 141 mmol/L (ref 134–144)
Total Protein: 6.6 g/dL (ref 6.0–8.5)

## 2019-01-28 LAB — CBC WITH DIFFERENTIAL/PLATELET
Basophils Absolute: 0 10*3/uL (ref 0.0–0.2)
Basos: 1 %
EOS (ABSOLUTE): 0.1 10*3/uL (ref 0.0–0.4)
Eos: 2 %
Hematocrit: 45.8 % (ref 37.5–51.0)
Hemoglobin: 15.9 g/dL (ref 13.0–17.7)
Immature Grans (Abs): 0 10*3/uL (ref 0.0–0.1)
Immature Granulocytes: 0 %
Lymphocytes Absolute: 2.6 10*3/uL (ref 0.7–3.1)
Lymphs: 45 %
MCH: 33.1 pg — ABNORMAL HIGH (ref 26.6–33.0)
MCHC: 34.7 g/dL (ref 31.5–35.7)
MCV: 95 fL (ref 79–97)
Monocytes Absolute: 0.5 10*3/uL (ref 0.1–0.9)
Monocytes: 9 %
Neutrophils Absolute: 2.4 10*3/uL (ref 1.4–7.0)
Neutrophils: 43 %
Platelets: 172 10*3/uL (ref 150–450)
RBC: 4.8 x10E6/uL (ref 4.14–5.80)
RDW: 12.9 % (ref 11.6–15.4)
WBC: 5.7 10*3/uL (ref 3.4–10.8)

## 2019-01-28 LAB — URINALYSIS, ROUTINE W REFLEX MICROSCOPIC
Bilirubin, UA: NEGATIVE
Glucose, UA: NEGATIVE
Ketones, UA: NEGATIVE
Leukocytes,UA: NEGATIVE
Nitrite, UA: NEGATIVE
Protein,UA: NEGATIVE
RBC, UA: NEGATIVE
Specific Gravity, UA: 1.009 (ref 1.005–1.030)
Urobilinogen, Ur: 0.2 mg/dL (ref 0.2–1.0)
pH, UA: 6.5 (ref 5.0–7.5)

## 2019-01-28 LAB — PSA: Prostate Specific Ag, Serum: 4.5 ng/mL — ABNORMAL HIGH (ref 0.0–4.0)

## 2019-01-28 LAB — TSH: TSH: 1.42 u[IU]/mL (ref 0.450–4.500)

## 2019-01-28 LAB — VITAMIN B12: Vitamin B-12: 1087 pg/mL (ref 232–1245)

## 2019-03-24 ENCOUNTER — Encounter: Payer: Self-pay | Admitting: Adult Health

## 2019-03-24 ENCOUNTER — Ambulatory Visit
Admission: RE | Admit: 2019-03-24 | Discharge: 2019-03-24 | Disposition: A | Discharge status: Home / Self Care | Payer: Managed Care, Other (non HMO) | Attending: Adult Health | Admitting: Adult Health

## 2019-03-24 ENCOUNTER — Other Ambulatory Visit: Payer: Self-pay

## 2019-03-24 ENCOUNTER — Ambulatory Visit
Admission: RE | Admit: 2019-03-24 | Discharge: 2019-03-24 | Disposition: A | Discharge status: Home / Self Care | Payer: Managed Care, Other (non HMO) | Source: Ambulatory Visit | Attending: Adult Health | Admitting: Adult Health

## 2019-03-24 ENCOUNTER — Ambulatory Visit: Payer: Managed Care, Other (non HMO) | Admitting: Adult Health

## 2019-03-24 DIAGNOSIS — Z7189 Other specified counseling: Secondary | ICD-10-CM | POA: Insufficient documentation

## 2019-03-24 DIAGNOSIS — R05 Cough: Secondary | ICD-10-CM

## 2019-03-24 DIAGNOSIS — Z20822 Contact with and (suspected) exposure to covid-19: Secondary | ICD-10-CM

## 2019-03-24 DIAGNOSIS — J029 Acute pharyngitis, unspecified: Secondary | ICD-10-CM | POA: Insufficient documentation

## 2019-03-24 DIAGNOSIS — R059 Cough, unspecified: Secondary | ICD-10-CM

## 2019-03-24 DIAGNOSIS — R6889 Other general symptoms and signs: Secondary | ICD-10-CM

## 2019-03-24 MED ORDER — BENZONATATE 100 MG PO CAPS: 100.0000 mg | ORAL_CAPSULE | Freq: Two times a day (BID) | ORAL | 0 refills | Status: DC | PRN

## 2019-03-24 MED ORDER — CLARITHROMYCIN 500 MG PO TABS: 500.0000 mg | ORAL_TABLET | Freq: Two times a day (BID) | ORAL | 0 refills | Status: DC

## 2019-03-24 NOTE — Progress Notes (Signed)
Virtual Visit via Telephone Note  I connected with Billy Foster on 03/24/19 at  2:30 PM EDT by telephone and verified that I am speaking with the correct person using two identifiers.  Location: Patient: ay home  Provider: [facility], [place]    I discussed the limitations, risks, security and privacy concerns of performing an evaluation and management service by telephone and the availability of in person appointments. I also discussed with the patient that there may be a patient responsible charge related to this service. The patient expressed understanding and agreed to proceed.   History of Present Illness: Patient is a 58 year old male in no acute distress who calls the clinic for a telephone visit during Covid 19 pandemic per virtual protocol.   He reports he was seen for a cough on 02/15/19( that had been present x 1 week at that time)  without shortness of breath and was given Delsym. He was Covid 19 tested at that time and found to be negative. He reports his cough got milder but did not resolve. He reports the last four days he has had a deeper cough. He reports he is still having symptoms of cough, with productive phlegm- clear/ thin. Feels " tickle " in his lungs.  Throat irritation. Denies any pain with inspiration or expiration.  No difficulty swallowing.  Afebrile without medications.  He has been out doing yard work.  Recent travel to the [place].   He started Delsym on Tuesday morning.   Denies nasal or head congestion.    Patient  denies any fever, body aches,chills, rash, chest pain, shortness of breath, nausea, vomiting, or diarrhea. He does wear CPAP at night has been able to sleep.  Observations/Objective: (Afebrile patient reports/ no other vitals and no numerical values given)    Patient is alert and oriented and responsive to questions Engages in conversation with provider. Speaks in full sentences without any pauses  without any shortness of breath or distress.   He is able to speak multiple words over 13-15 non stop without any dyspnea noted and does not have cough while talking. He does cough a couple of deep coughs while provider is talking.  Assessment and Plan:  1. Educated About Covid-19 Virus Infection   2. Cough   3. Sore throat   4. Suspected Covid-19 Virus Infection    Suspect possible pneumonia given duration of cough and worsening last 4 days.  Orders as below patient is aware of chest x ray order and coronavirus testing drive through at [facility], must arrive before 330 in order to get testing.   Meds ordered this encounter  Medications  . clarithromycin (BIAXIN) 500 MG tablet    Sig: Take 1 tablet (500 mg total) by mouth 2 (two) times daily.    Dispense:  14 tablet    Refill:  0  . benzonatate (TESSALON) 100 MG capsule    Sig: Take 1 capsule (100 mg total) by mouth 2 (two) times daily as needed for cough (may cause drowsiness).    Dispense:  20 capsule    Refill:  0     Orders Placed This Encounter  Procedures  . Novel Coronavirus, NAA ([facility])    Cough/ coming to [facility] advised to be there before 330 and wear a mask    Order Specific Question:   Known Exposure    Answer:   Potential  . DG Chest 2 View  Standing Status:   Future    Standing Expiration Date:   05/24/2020    Order Specific Question:   Reason for Exam (SYMPTOM  OR DIAGNOSIS REQUIRED)    Answer:   cough since 7/23 rule out pneumonia/ covid test was negative from 7/23 sending for repeat testing FYI    Order Specific Question:   Preferred imaging location?    Answer:   [facility]    Order Specific Question:   Call Results- Best Contact Number?    Answer:   [phone] if abnormal only    Order Specific Question:   Radiology Contrast Protocol - do NOT remove file path    Answer:   [email or web address]     Follow Up Instructions:  Advised of RED FLAGS and  when to seek emergency care immediately at emergency room and in person visit.  Quarantine until 03/31/19 see communications for work note and must meet CDC criteria on note to return to work or being out.   Advised patient call the office or your primary care doctor for an appointment if no improvement within 72 hours or if any symptoms change or worsen at any time  Advised ER or urgent Care if after hours or on weekend. Call 911 for emergency symptoms at any time.Patinet verbalized understanding of all instructions given/reviewed and treatment plan and has no further questions or concerns at this time.    I discussed the assessment and treatment plan with the patient. The patient was provided an opportunity to ask questions and all were answered. The patient agreed with the plan and demonstrated an understanding of the instructions.   The patient was advised to call back or seek an in-person evaluation if the symptoms worsen or if the condition fails to improve as anticipated.  I provided 15 minutes of non-face-to-face time during this encounter.   Marcille Buffy, FNP

## 2019-03-24 NOTE — Patient Instructions (Signed)
Advised patient call the office or your primary care doctor for an appointment if no improvement within 72 hours or if any symptoms change or worsen at any time  Advised ER or urgent Care if after hours or on weekend. Call 911 for emergency symptoms at any time.Patinet verbalized understanding of all instructions given/reviewed and treatment plan and has no further questions or concerns at this time.      Cough, Adult A cough helps to clear your throat and lungs. A cough may be a sign of an illness or another medical condition. An acute cough may only last 2-3 weeks, while a chronic cough may last 8 or more weeks. Many things can cause a cough. They include:  Germs (viruses or bacteria) that attack the airway.  Breathing in things that bother (irritate) your lungs.  Allergies.  Asthma.  Mucus that runs down the back of your throat (postnasal drip).  Smoking.  Acid backing up from the stomach into the tube that moves food from the mouth to the stomach (gastroesophageal reflux).  Some medicines.  Lung problems.  Other medical conditions, such as heart failure or a blood clot in the lung (pulmonary embolism). Follow these instructions at home: Medicines  Take over-the-counter and prescription medicines only as told by your doctor.  Talk with your doctor before you take medicines that stop a cough (coughsuppressants). Lifestyle   Do not smoke, and try not to be around smoke. Do not use any products that contain nicotine or tobacco, such as cigarettes, e-cigarettes, and chewing tobacco. If you need help quitting, ask your doctor.  Drink enough fluid to keep your pee (urine) pale yellow.  Avoid caffeine.  Do not drink alcohol if your doctor tells you not to drink. General instructions   Watch for any changes in your cough. Tell your doctor about them.  Always cover your mouth when you cough.  Stay away from things that make you cough, such as perfume, candles, campfire  smoke, or cleaning products.  If the air is dry, use a cool mist vaporizer or humidifier in your home.  If your cough is worse at night, try using extra pillows to raise your head up higher while you sleep.  Rest as needed.  Keep all follow-up visits as told by your doctor. This is important. Contact a doctor if:  You have new symptoms.  You cough up pus.  Your cough does not get better after 2-3 weeks, or your cough gets worse.  Cough medicine does not help your cough and you are not sleeping well.  You have pain that gets worse or pain that is not helped with medicine.  You have a fever.  You are losing weight and you do not know why.  You have night sweats. Get help right away if:  You cough up blood.  You have trouble breathing.  Your heartbeat is very fast. These symptoms may be an emergency. Do not wait to see if the symptoms will go away. Get medical help right away. Call your local emergency services (911 in the [place] Do not drive yourself to the hospital. Summary  A cough helps to clear your throat and lungs. Many things can cause a cough.  Take over-the-counter and prescription medicines only as told by your doctor.  Always cover your mouth when you cough.  Contact a doctor if you have new symptoms or you have a cough that does not get better or gets worse. This information is not intended to  replace advice given to you by your health care provider. Make sure you discuss any questions you have with your health care provider. Document Released: 04/24/2011 Document Revised: 08/30/2018 Document Reviewed: 08/30/2018 Elsevier Patient Education  2020 [place] capsules What is this medicine? BENZONATATE (ben ZOE na tate) is used to treat cough. This medicine may be used for other purposes; ask your health care provider or pharmacist if you have questions. COMMON BRAND NAME(S): Tessalon Perles, Zonatuss What should I tell my health care provider  before I take this medicine? They need to know if you have any of these conditions:  kidney or liver disease  an unusual or allergic reaction to benzonatate, anesthetics, other medicines, foods, dyes, or preservatives  pregnant or trying to get pregnant  breast-feeding How should I use this medicine? Take this medicine by mouth with a glass of water. Follow the directions on the prescription label. Avoid breaking, chewing, or sucking the capsule, as this can cause serious side effects. Take your medicine at regular intervals. Do not take your medicine more often than directed. Talk to your pediatrician regarding the use of this medicine in children. While this drug may be prescribed for children as young as 70 years old for selected conditions, precautions do apply. Overdosage: If you think you have taken too much of this medicine contact a poison control center or emergency room at once. NOTE: This medicine is only for you. Do not share this medicine with others. What if I miss a dose? If you miss a dose, take it as soon as you can. If it is almost time for your next dose, take only that dose. Do not take double or extra doses. What may interact with this medicine? Do not take this medicine with any of the following medications:  MAOIs like Carbex, Eldepryl, Marplan, Nardil, and Parnate This list may not describe all possible interactions. Give your health care provider a list of all the medicines, herbs, non-prescription drugs, or dietary supplements you use. Also tell them if you smoke, drink alcohol, or use illegal drugs. Some items may interact with your medicine. What should I watch for while using this medicine? Tell your doctor if your symptoms do not improve or if they get worse. If you have a high fever, skin rash, or headache, see your health care professional. You may get drowsy or dizzy. Do not drive, use machinery, or do anything that needs mental alertness until you know how  this medicine affects you. Do not sit or stand up quickly, especially if you are an older patient. This reduces the risk of dizzy or fainting spells. What side effects may I notice from receiving this medicine? Side effects that you should report to your doctor or health care professional as soon as possible:  allergic reactions like skin rash, itching or hives, swelling of the face, lips, or tongue  breathing problems  chest pain  confusion or hallucinations  irregular heartbeat  numbness of mouth or throat  seizures Side effects that usually do not require medical attention (report to your doctor or health care professional if they continue or are bothersome):  burning feeling in the eyes  constipation  headache  nasal congestion  stomach upset This list may not describe all possible side effects. Call your doctor for medical advice about side effects. You may report side effects to FDA at 1-800-FDA-1088. Where should I keep my medicine? Keep out of the reach of children. Store at room temperature between  15 and 30 degrees C (59 and 86 degrees F). Keep tightly closed. Protect from light and moisture. Throw away any unused medicine after the expiration date. NOTE: This sheet is a summary. It may not cover all possible information. If you have questions about this medicine, talk to your doctor, pharmacist, or health care provider.  2020 Elsevier/Gold Standard (2007-11-10 14:52:56) Clarithromycin tablets What is this medicine? CLARITHROMYCIN (kla RITH roe mye sin) is a macrolide antibiotic. It is used to treat or prevent certain kinds of bacterial infections. It will not work for colds, flu, or other viral infections. This medicine may be used for other purposes; ask your health care provider or pharmacist if you have questions. COMMON BRAND NAME(S): Biaxin What should I tell my health care provider before I take this medicine? They need to know if you have any of these  conditions:  heart disease  history of irregular heartbeat  kidney disease  liver disease  myasthenia gravis  an unusual or allergic reaction to clarithromycin, other macrolide antibiotics, other medicines, foods, dyes, or preservatives  pregnant or trying to get pregnant  breast-feeding How should I use this medicine? Take this medicine by mouth with glass of water. If it upsets your stomach you can take it with milk or food. Follow the directions on the prescription label. Take your medicine at regular intervals. Do not take your medicine more often than directed. Take all of your medicine as directed even if you think your are better. Do not skip doses or stop your medicine early. Talk to your pediatrician regarding the use of this medicine in children. Special care may be needed. Overdosage: If you think you have taken too much of this medicine contact a poison control center or emergency room at once. NOTE: This medicine is only for you. Do not share this medicine with others. What if I miss a dose? If you miss a dose, take it as soon as you can. If it is almost time for your next dose, take only that dose. Do not take double or extra doses. What may interact with this medicine? Do not take this medicine with any of the following medications:  certain medicines for fungal infections like fluconazole, itraconazole, ketoconazole, posaconazole, voriconazole  cisapride  dronedarone  naloxegol  pimozide  thioridazine This medicine may also interact with the following medications:  birth control pills  carbamazepine  certain medicines for anxiety or sleep like alprazolam, triazolam  certain medicines for cholesterol like atorvastatin, lovastatin, simvastatin  certain medicines for irregular heart beat like amiodarone, disopyramide, flecainide, procainamide, quinidine  certain medicines that treat or prevent clots like  warfarin  colchicine  cyclosporine  digoxin  dofetilide  ergot alkaloids like ergotamine, dihydroergotamine  other antibiotics like grepafloxacin, rifabutin, sparfloxacin  other medicines that prolong the QT interval (cause an abnormal heart rhythm)  ritonavir  sildenafil  terfenadine  theophylline  zidovudine  ziprasidone This list may not describe all possible interactions. Give your health care provider a list of all the medicines, herbs, non-prescription drugs, or dietary supplements you use. Also tell them if you smoke, drink alcohol, or use illegal drugs. Some items may interact with your medicine. What should I watch for while using this medicine? Tell your doctor or health care provider if your symptoms do not improve. This medicine may cause serious skin reactions. They can happen weeks to months after starting the medicine. Contact your health care provider right away if you notice fevers or flu-like symptoms with a  rash. The rash may be red or purple and then turn into blisters or peeling of the skin. Or, you might notice a red rash with swelling of the face, lips or lymph nodes in your neck or under your arms. Do not treat diarrhea with over the counter products. Contact your doctor if you have diarrhea that lasts more than 2 days or if it is severe and watery. If you have diabetes, monitor your blood sugar carefully while on this medicine. What side effects may I notice from receiving this medicine? Side effects that you should report to your doctor or health care professional as soon as possible:  allergic reactions like skin rash, itching or hives, swelling of the face, lips, or tongue  irregular heartbeat or chest pain  pain or difficulty passing urine  rash, fever, and swollen lymph nodes  redness, blistering, peeling or loosening of the skin, including inside the mouth  yellowing of the eyes or skin Side effects that usually do not require medical  attention (report to your doctor or health care professional if they continue or are bothersome):  abnormal taste  anxiety, confusion, or nightmares  diarrhea  headache  intestinal gas  stomach upset or nausea This list may not describe all possible side effects. Call your doctor for medical advice about side effects. You may report side effects to FDA at 1-800-FDA-1088. Where should I keep my medicine? Keep out of the reach of children. Store at room temperature between 20 and 25 degrees C (68 and 77 degrees F). Keep container tightly closed. Protect from light. Throw away any unused medicine after the expiration date. NOTE: This sheet is a summary. It may not cover all possible information. If you have questions about this medicine, talk to your doctor, pharmacist, or health care provider.  2020 Elsevier/Gold Standard (2018-11-11 11:40:12) Billy Foster,  You are being placed in the home monitoring program for COVID-19 (commonly known as Coronavirus).  This is because you are suspected to have the virus or are known to have the virus.  If you are unsure which group you fall into call your clinic.    As part of this program, you'll answer a daily questionnaire in the MyChart mobile app. You'll receive a notification through the MyChart app when the questionnaire is available. When you log in to MyChart, you'll see the tasks in your To Do activity.       Clinicians will see any answers that are concerning and take appropriate steps.  If at any point you are having a medical emergency, call 911.  If otherwise concerned call your clinic instead of coming into the clinic or hospital.  To keep from spreading the disease you should: Stay home and limit contact with other people as much as possible.  Wash your hands frequently. Cover your coughs and sneezes with a tissue, and throw used tissues in the trash.   Clean and disinfect frequently touched surfaces and objects.    Take care of  yourself by: Staying home Resting Drinking fluids Take fever-reducing medications (Tylenol/Acetaminophen and Ibuprofen)  For more information on the disease go to the Centers for Disease Control and Prevention website   COVID-19: How to Protect Yourself and Others Know how it spreads  There is currently no vaccine to prevent coronavirus disease 2019 (COVID-19).  The best way to prevent illness is to avoid being exposed to this virus.  The virus is thought to spread mainly from person-to-person. ? Between people who are in  close contact with one another (within about 6 feet). ? Through respiratory droplets produced when an infected person coughs, sneezes or talks. ? These droplets can land in the mouths or noses of people who are nearby or possibly be inhaled into the lungs. ? Some recent studies have suggested that COVID-19 may be spread by people who are not showing symptoms. Everyone should Clean your hands often  Wash your hands often with soap and water for at least 20 seconds especially after you have been in a public place, or after blowing your nose, coughing, or sneezing.  If soap and water are not readily available, use a hand sanitizer that contains at least 60% alcohol. Cover all surfaces of your hands and rub them together until they feel dry.  Avoid touching your eyes, nose, and mouth with unwashed hands. Avoid close contact  Stay home if you are sick.  Avoid close contact with people who are sick.  Put distance between yourself and other people. ? Remember that some people without symptoms may be able to spread virus. ? This is especially important for people who are at higher risk of getting very [email or web address] Cover your mouth and nose with a cloth face cover when around others  You could spread COVID-19 to others even if you do not feel sick.  Everyone should wear a cloth face cover  when they have to go out in public, for example to the grocery store or to pick up other necessities. ? Cloth face coverings should not be placed on young children under age 49, anyone who has trouble breathing, or is unconscious, incapacitated or otherwise unable to remove the mask without assistance.  The cloth face cover is meant to protect other people in case you are infected.  Do NOT use a facemask meant for a Dietitian.  Continue to keep about 6 feet between yourself and others. The cloth face cover is not a substitute for social distancing. Cover coughs and sneezes  If you are in a private setting and do not have on your cloth face covering, remember to always cover your mouth and nose with a tissue when you cough or sneeze or use the inside of your elbow.  Throw used tissues in the trash.  Immediately wash your hands with soap and water for at least 20 seconds. If soap and water are not readily available, clean your hands with a hand sanitizer that contains at least 60% alcohol. Clean and disinfect  Clean AND disinfect frequently touched surfaces daily. This includes tables, doorknobs, light switches, countertops, handles, desks, phones, keyboards, toilets, faucets, and sinks. [email or web address]  If surfaces are dirty, clean them: Use detergent or soap and water prior to disinfection.  Then, use a household disinfectant. You can see a list of EPA-registered household disinfectants here. michellinders.com 12/28/2018 This information is not intended to replace advice given to you by your health care provider. Make sure you discuss any questions you have with your health care provider. Document Released: 12/07/2018 Document Revised: 01/05/2019 Document Reviewed: 12/07/2018 Elsevier Patient Education  [place].

## 2019-03-25 MED ORDER — CLARITHROMYCIN 500 MG PO TABS: 500.0000 mg | ORAL_TABLET | Freq: Two times a day (BID) | ORAL | 0 refills | Status: DC

## 2019-03-25 MED ORDER — BENZONATATE 100 MG PO CAPS: 100.0000 mg | ORAL_CAPSULE | Freq: Two times a day (BID) | ORAL | 0 refills | Status: DC | PRN

## 2019-03-25 NOTE — Addendum Note (Signed)
Addended by: Doreen Beam on: 03/25/2019 07:59 AM   Modules accepted: Orders

## 2019-03-27 LAB — NOVEL CORONAVIRUS, NAA: SARS-CoV-2, NAA: NOT DETECTED

## 2019-03-28 ENCOUNTER — Encounter: Payer: Self-pay | Admitting: Adult Health

## 2019-05-09 ENCOUNTER — Other Ambulatory Visit: Payer: Self-pay

## 2019-05-09 DIAGNOSIS — Z20822 Contact with and (suspected) exposure to covid-19: Secondary | ICD-10-CM

## 2019-05-10 LAB — NOVEL CORONAVIRUS, NAA: SARS-CoV-2, NAA: NOT DETECTED

## 2019-12-12 ENCOUNTER — Other Ambulatory Visit: Payer: Self-pay

## 2019-12-12 ENCOUNTER — Other Ambulatory Visit: Payer: Managed Care, Other (non HMO)

## 2019-12-12 DIAGNOSIS — N138 Other obstructive and reflux uropathy: Secondary | ICD-10-CM | POA: Diagnosis not present

## 2019-12-12 DIAGNOSIS — R972 Elevated prostate specific antigen [PSA]: Secondary | ICD-10-CM

## 2019-12-12 DIAGNOSIS — N401 Enlarged prostate with lower urinary tract symptoms: Secondary | ICD-10-CM

## 2019-12-12 DIAGNOSIS — Z125 Encounter for screening for malignant neoplasm of prostate: Secondary | ICD-10-CM

## 2019-12-13 LAB — PSA: Prostate Specific Ag, Serum: 3.6 ng/mL (ref 0.0–4.0)

## 2020-01-10 ENCOUNTER — Telehealth: Payer: Self-pay

## 2020-01-10 NOTE — Telephone Encounter (Signed)
LM on VM please call here

## 2020-01-10 NOTE — Telephone Encounter (Signed)
Pt called requesting a rx for Mometasone cream and Mupirocin ointment he used in the past for Eczema, pt report at his last office visit here 10-03-19 he was given Metronidazole lotion which is not helping.  [place]

## 2020-01-10 NOTE — Telephone Encounter (Signed)
Is he out of Mometasone? When was he last seen? Ideally, I would like to see him and sort this out so we are sure he is using appropriate treatments that will not give him adverse side effects. Please make him an appt.

## 2020-01-10 NOTE — Telephone Encounter (Signed)
Pt called to let me know he uses Mometasone cream qd and Mupirocin ointment qd for a red rash around his eyes with a good response,   He was given Metronidazole lotion at his last office visit, he tried it on the red rash around his eyes and it does not help,  I discussed with pt Mometasone and mupirocin are used for eczema and Metronidazole is used for Rosacea

## 2020-01-10 NOTE — Telephone Encounter (Signed)
Please review his last visit with him from [place]. Metrogel is not for Eczema, so please make sure he understands how to use meds and what for. Let me know after reviewing note what we were treating his eczema with.

## 2020-01-11 NOTE — Telephone Encounter (Signed)
Discussed with pt Dr would like to recheck him in the office

## 2020-02-09 ENCOUNTER — Other Ambulatory Visit: Payer: Self-pay

## 2020-02-09 ENCOUNTER — Ambulatory Visit (INDEPENDENT_AMBULATORY_CARE_PROVIDER_SITE_OTHER): Payer: Managed Care, Other (non HMO) | Admitting: Dermatology

## 2020-02-09 DIAGNOSIS — L719 Rosacea, unspecified: Secondary | ICD-10-CM | POA: Diagnosis not present

## 2020-02-09 DIAGNOSIS — L309 Dermatitis, unspecified: Secondary | ICD-10-CM | POA: Diagnosis not present

## 2020-02-09 MED ORDER — MOMETASONE FUROATE 0.1 % EX CREA: 1.0000 "application " | TOPICAL_CREAM | CUTANEOUS | 1 refills | Status: DC

## 2020-02-09 NOTE — Patient Instructions (Addendum)
Instructions for [facility] Medications  One or more of your medications was sent to the [facility] mail order compounding pharmacy. You will receive an email from them and can purchase the medicine through that link. It will then be mailed to your home at the address you confirmed. If for any reason you do not receive an email from them, please check your spam folder. If you still do not find the email, please let us know.    Mometasone for around the eyes when flared, may use once daily up to 5 days a week.  Rosacea triple cream use nightly to face, may use 2 times a day if flared.

## 2020-02-09 NOTE — Progress Notes (Signed)
   Follow-Up Visit   Subjective  Billy Foster is a 59 y.o. male who presents for the following: Rosacea (face, Metronidazole lotion 0.75% prn) and eczema (periocular mometasone and mupirocin ).  The following portions of the chart were reviewed this encounter and updated as appropriate:  Tobacco  Allergies  Meds  Problems  Med Hx  Surg Hx  Fam Hx      Review of Systems:  No other skin or systemic complaints except as noted in HPI or Assessment and Plan.  Objective  Well appearing patient in no apparent distress; mood and affect are within normal limits.  A focused examination was performed including face. Relevant physical exam findings are noted in the Assessment and Plan.  Objective  Head - Anterior (Face): Pustule nasal root, pap R lower glabella,   Objective  periocular: Redness under eyelids   Assessment & Plan    Rosacea Head - Anterior (Face)  Start [facility] metronidazole/ivermectin/azelaic acid qd/bid daily as needed to affected areas on the face. The patient was advised this is not covered by insurance. They will receive an email to check out and the medication will be mailed to their home.    Dermatitis, eyelid -may represent a contact dermatitis periocular  Eyelid Dermatitis  Discussed True Patch Test 36, will schedule pt  Cont Mometasone cr qd up to 5 days a week prn flares  mometasone (ELOCON) 0.1 % cream - periocular  Return for schedule for patch testing on monday or tuesday, 3 appts.   I, Othelia Pulling, RMA, am acting as scribe for Sarina Ser, MD .  Documentation: I have reviewed the above documentation for accuracy and completeness, and I agree with the above.  Sarina Ser, MD

## 2020-02-12 ENCOUNTER — Encounter: Payer: Self-pay | Admitting: Dermatology

## 2020-02-21 ENCOUNTER — Other Ambulatory Visit: Payer: Self-pay

## 2020-02-21 ENCOUNTER — Ambulatory Visit (INDEPENDENT_AMBULATORY_CARE_PROVIDER_SITE_OTHER): Payer: Managed Care, Other (non HMO)

## 2020-02-21 DIAGNOSIS — L259 Unspecified contact dermatitis, unspecified cause: Secondary | ICD-10-CM

## 2020-02-21 NOTE — Progress Notes (Signed)
Patient here to start patch testing.   True Test X36 applied to patients upper back. Patient advised do not get panels 1-3 wet.   Patient to return with me on Thursday to remove panels and read for Day 3.

## 2020-02-23 ENCOUNTER — Other Ambulatory Visit: Payer: Self-pay

## 2020-02-23 ENCOUNTER — Ambulatory Visit (INDEPENDENT_AMBULATORY_CARE_PROVIDER_SITE_OTHER): Payer: Managed Care, Other (non HMO)

## 2020-02-23 DIAGNOSIS — L239 Allergic contact dermatitis, unspecified cause: Secondary | ICD-10-CM

## 2020-02-23 NOTE — Progress Notes (Signed)
Patient here for day 3 of patch test reading.   True Test X36 removed from back. Patient had positive reactions to sites # 6, 23 and 28. Patient had questionable reactions to sites # 27, 30, 31 and 33.   Patient advised do not soak or scrub area of back. He will return to the office next week for follow up with Dr. Brendolyn Patty.

## 2020-02-28 ENCOUNTER — Other Ambulatory Visit: Payer: Self-pay

## 2020-02-28 ENCOUNTER — Ambulatory Visit (INDEPENDENT_AMBULATORY_CARE_PROVIDER_SITE_OTHER): Payer: Managed Care, Other (non HMO) | Admitting: Dermatology

## 2020-02-28 DIAGNOSIS — L239 Allergic contact dermatitis, unspecified cause: Secondary | ICD-10-CM | POA: Diagnosis not present

## 2020-02-28 DIAGNOSIS — L719 Rosacea, unspecified: Secondary | ICD-10-CM

## 2020-02-28 MED ORDER — DOXYCYCLINE HYCLATE 100 MG PO CAPS: ORAL_CAPSULE | ORAL | 1 refills | Status: DC

## 2020-02-28 MED ORDER — MUPIROCIN 2 % EX OINT: TOPICAL_OINTMENT | CUTANEOUS | 2 refills | Status: DC

## 2020-02-28 MED ORDER — EUCRISA 2 % EX OINT: TOPICAL_OINTMENT | CUTANEOUS | 0 refills | Status: AC

## 2020-02-28 NOTE — Progress Notes (Signed)
° °  Follow-Up Visit   Subjective  Billy Foster is a 59 y.o. male who presents for the following: Follow-up (Patch Test Day 7 - Patient noticed reactions to 6, 23, 27, 28, 30, 35, 36.).  Eyelid dermatitis- using mometasone cream.   The following portions of the chart were reviewed this encounter and updated as appropriate:      Review of Systems:  No other skin or systemic complaints except as noted in HPI or Assessment and Plan.  Objective  Well appearing patient in no apparent distress; mood and affect are within normal limits.  A focused examination was performed including face, back. Relevant physical exam findings are noted in the Assessment and Plan.  Objective  Back: Back- 2+ reaction to #23Thimerosal, #33 Bacitracin 1+ reaction to #27 Tixocortol- 21-Pivalate Faint reaction to #3 Neomycin, #6 Fragrance Mix, #28 Gold Sodium Thiosulfate Erythema lower eyelids  Objective  upper and lower eyelids: Erythema on bilateral lower eyelids (with small pink papules) and medial upper eyelids.   Assessment & Plan  Allergic contact dermatitis, unspecified trigger Back  Eyelid Dermatitis- Start Eucrisa Ointment Apply to affected areas around eyes twice a day prn flares  Patch Test info given to patient for #3, 6, 23, 27, 28, 33 (see objective). Advised patient to review info and avoid products containing these chemicals.  Recommend VaniCream products and Free and Clear Zinc shampoo, samples given.  Recommend fragrance free personal care products. . d/c mometasone cream (pt was positive to Tixocortol- class A, and mometasone is class D1 with cross reaction possible).  For future he can use steroids from Class C (cloderm, topicort) which does not cross react. Use mupirocin ointment prn instead of OTC topical antibiotics (Neosporin, Polysporin)    Crisaborole (EUCRISA) 2 % OINT - Back  mupirocin ointment (BACTROBAN) 2 % - Back  Rosacea upper and lower eyelids  vs  Periorificial Dermatitis  Start doxycycline 100mg  take 1 po qd dsp #30 1Rf. Avoid topical steroid around eyes  Doxycycline should be taken with food to prevent nausea. Do not lay down for 30 minutes after taking. Be cautious with sun exposure and use good sun protection while on this medication. Pregnant women should not take this medication.    doxycycline (VIBRAMYCIN) 100 MG capsule - upper and lower eyelids  Return in about 6 weeks (around 04/10/2020) for ACD with Dr Raliegh Ip.   I, Jamesetta Orleans, CMA, am acting as scribe for Brendolyn Patty, MD .  Documentation: I have reviewed the above documentation for accuracy and completeness, and I agree with the above.  Brendolyn Patty MD

## 2020-02-28 NOTE — Patient Instructions (Addendum)
Doxycycline should be taken with food to prevent nausea. Do not lay down for 30 minutes after taking. Be cautious with sun exposure and use good sun protection while on this medication. Pregnant women should not take this medication.    Eucrisa Ointment - Apply to rash around eyes twice daily as needed.

## 2020-03-12 ENCOUNTER — Other Ambulatory Visit: Payer: Self-pay

## 2020-03-12 ENCOUNTER — Ambulatory Visit: Payer: Managed Care, Other (non HMO) | Admitting: Nurse Practitioner

## 2020-03-12 ENCOUNTER — Encounter: Payer: Self-pay | Admitting: Nurse Practitioner

## 2020-03-12 VITALS — BP 122/74 | HR 46 | Temp 97.3°F | Resp 16 | Ht 71.0 in | Wt 200.0 lb

## 2020-03-12 DIAGNOSIS — Z008 Encounter for other general examination: Secondary | ICD-10-CM | POA: Diagnosis not present

## 2020-03-12 DIAGNOSIS — Z0000 Encounter for general adult medical examination without abnormal findings: Secondary | ICD-10-CM

## 2020-03-12 NOTE — Patient Instructions (Signed)
Bradycardia, Adult Bradycardia is a slower-than-normal heartbeat. A normal resting heart rate for an adult ranges from 60 to 100 beats per minute. With bradycardia, the resting heart rate is less than 60 beats per minute. Bradycardia can prevent enough oxygen from reaching certain areas of your body when you are active. It can be serious if it keeps enough oxygen from reaching your brain and other parts of your body. Bradycardia is not a problem for everyone. For some healthy adults, a slow resting heart rate is normal. What are the causes? This condition may be caused by:  A problem with the heart, including: ? A problem with the heart's electrical system, such as a heart block. With a heart block, electrical signals between the chambers of the heart are partially or completely blocked, so they are not able to work as they should. ? A problem with the heart's natural pacemaker (sinus node). ? Heart disease. ? A heart attack. ? Heart damage. ? Lyme disease. ? A heart infection. ? A heart condition that is present at birth (congenital heart defect).  Certain medicines that treat heart conditions.  Certain conditions, such as hypothyroidism and obstructive sleep apnea.  Problems with the balance of chemicals and other substances, like potassium, in the blood.  Trauma.  Radiation therapy. What increases the risk? You are more likely to develop this condition if you:  Are age 65 or older.  Have high blood pressure (hypertension), high cholesterol (hyperlipidemia), or diabetes.  Drink heavily, use tobacco or nicotine products, or use drugs. What are the signs or symptoms? Symptoms of this condition include:  Light-headedness.  Feeling faint or fainting.  Fatigue and weakness.  Trouble with activity or exercise.  Shortness of breath.  Chest pain (angina).  Drowsiness.  Confusion.  Dizziness. How is this diagnosed? This condition may be diagnosed based on:  Your  symptoms.  Your medical history.  A physical exam. During the exam, your health care provider will listen to your heartbeat and check your pulse. To confirm the diagnosis, your health care provider may order tests, such as:  Blood tests.  An electrocardiogram (ECG). This test records the heart's electrical activity. The test can show how fast your heart is beating and whether the heartbeat is steady.  A test in which you wear a portable device (event recorder or Holter monitor) to record your heart's electrical activity while you go about your day.  Anexercise test. How is this treated? Treatment for this condition depends on the cause of the condition and how severe your symptoms are. Treatment may involve:  Treatment of the underlying condition.  Changing your medicines or how much medicine you take.  Having a small, battery-operated device called a pacemaker implanted under the skin. When bradycardia occurs, this device can be used to increase your heart rate and help your heart beat in a regular rhythm. Follow these instructions at home: Lifestyle   Manage any health conditions that contribute to bradycardia as told by your health care provider.  Follow a heart-healthy diet. A nutrition specialist (dietitian) can help educate you about healthy food options and changes.  Follow an exercise program that is approved by your health care provider.  Maintain a healthy weight.  Try to reduce or manage your stress, such as with yoga or meditation. If you need help reducing stress, ask your health care provider.  Do not use any products that contain nicotine or tobacco, such as cigarettes, e-cigarettes, and chewing tobacco. If you need help   quitting, ask your health care provider.  Do not use illegal drugs.  Limit alcohol intake to no more than 1 drink a day for nonpregnant women and 2 drinks a day for men. Be aware of how much alcohol is in your drink. In the [place] one drink  equals one 12 oz bottle of beer (355 mL), one 5 oz glass of wine (148 mL), or one 1 oz glass of hard liquor (44 mL). General instructions  Take over-the-counter and prescription medicines only as told by your health care provider.  Keep all follow-up visits as told by your health care provider. This is important. How is this prevented? In some cases, bradycardia may be prevented by:  Treating underlying medical problems.  Stopping behaviors or medicines that can trigger the condition. Contact a health care provider if you:  Feel light-headed or dizzy.  Almost faint.  Feel weak or are easily fatigued during physical activity.  Experience confusion or have memory problems. Get help right away if:  You faint.  You have: ? An irregular heartbeat (palpitations). ? Chest pain. ? Trouble breathing. Summary  Bradycardia is a slower-than-normal heartbeat. With bradycardia, the resting heart rate is less than 60 beats per minute.  Treatment for this condition depends on the cause.  Manage any health conditions that contribute to bradycardia as told by your health care provider.  Do not use any products that contain nicotine or tobacco, such as cigarettes, e-cigarettes, and chewing tobacco, and limit alcohol intake.  Keep all follow-up visits as told by your health care provider. This is important. This information is not intended to replace advice given to you by your health care provider. Make sure you discuss any questions you have with your health care provider. Document Revised: 02/22/2018 Document Reviewed: 01/20/2018 Elsevier Patient Education  2020 [employer]. Exercising to Stay Healthy To become healthy and stay healthy, it is recommended that you do moderate-intensity and vigorous-intensity exercise. You can tell that you are exercising at a moderate intensity if your heart starts beating faster and you start breathing faster but can still hold a conversation. You can  tell that you are exercising at a vigorous intensity if you are breathing much harder and faster and cannot hold a conversation while exercising. Exercising regularly is important. It has many health benefits, such as:  Improving overall fitness, flexibility, and endurance.  Increasing bone density.  Helping with weight control.  Decreasing body fat.  Increasing muscle strength.  Reducing stress and tension.  Improving overall health. How often should I exercise? Choose an activity that you enjoy, and set realistic goals. Your health care provider can help you make an activity plan that works for you. Exercise regularly as told by your health care provider. This may include:  Doing strength training two times a week, such as: ? Lifting weights. ? Using resistance bands. ? Push-ups. ? Sit-ups. ? Yoga.  Doing a certain intensity of exercise for a given amount of time. Choose from these options: ? A total of 150 minutes of moderate-intensity exercise every week. ? A total of 75 minutes of vigorous-intensity exercise every week. ? A mix of moderate-intensity and vigorous-intensity exercise every week. Children, pregnant women, people who have not exercised regularly, people who are overweight, and older adults may need to talk with a health care provider about what activities are safe to do. If you have a medical condition, be sure to talk with your health care provider before you start a new exercise program.  What are some exercise ideas? Moderate-intensity exercise ideas include:  Walking 1 mile (1.6 km) in about 15 minutes.  Biking.  Hiking.  Golfing.  Dancing.  Water aerobics. Vigorous-intensity exercise ideas include:  Walking 4.5 miles (7.2 km) or more in about 1 hour.  Jogging or running 5 miles (8 km) in about 1 hour.  Biking 10 miles (16.1 km) or more in about 1 hour.  Lap swimming.  Roller-skating or in-line skating.  Cross-country skiing.  Vigorous  competitive sports, such as football, basketball, and soccer.  Jumping rope.  Aerobic dancing. What are some everyday activities that can help me to get exercise?  [place] work, such as: ? Pushing a Conservation officer, nature. ? Raking and bagging leaves.  Washing your car.  Pushing a stroller.  Shoveling snow.  Gardening.  Washing windows or floors. How can I be more active in my day-to-day activities?  Use stairs instead of an elevator.  Take a walk during your lunch break.  If you drive, park your car farther away from your work or school.  If you take public transportation, get off one stop early and walk the rest of the way.  Stand up or walk around during all of your indoor phone calls.  Get up, stretch, and walk around every 30 minutes throughout the day.  Enjoy exercise with a friend. Support to continue exercising will help you keep a regular routine of activity. What guidelines can I follow while exercising?  Before you start a new exercise program, talk with your health care provider.  Do not exercise so much that you hurt yourself, feel dizzy, or get very short of breath.  Wear comfortable clothes and wear shoes with good support.  Drink plenty of water while you exercise to prevent dehydration or heat stroke.  Work out until your breathing and your heartbeat get faster. Where to find more information  [place] Department of Health and Human Services: [employer]at  Centers for Disease Control and Prevention (CDC): http://www.wolf.info/ Summary  Exercising regularly is important. It will improve your overall fitness, flexibility, and endurance.  Regular exercise also will improve your overall health. It can help you control your weight, reduce stress, and improve your bone density.  Do not exercise so much that you hurt yourself, feel dizzy, or get very short of breath.  Before you start a new exercise program, talk with your health care provider. This information is not intended  to replace advice given to you by your health care provider. Make sure you discuss any questions you have with your health care provider. Document Revised: 07/24/2017 Document Reviewed: 07/02/2017 Elsevier Patient Education  2020 Brady Maintenance, Male Adopting a healthy lifestyle and getting preventive care are important in promoting health and wellness. Ask your health care provider about:  The right schedule for you to have regular tests and exams.  Things you can do on your own to prevent diseases and keep yourself healthy. What should I know about diet, weight, and exercise? Eat a healthy diet   Eat a diet that includes plenty of vegetables, fruits, low-fat dairy products, and lean protein.  Do not eat a lot of foods that are high in solid fats, added sugars, or sodium. Maintain a healthy weight Body mass index (BMI) is a measurement that can be used to identify possible weight problems. It estimates body fat based on height and weight. Your health care provider can help determine your BMI and help you achieve or maintain a healthy  weight. Get regular exercise Get regular exercise. This is one of the most important things you can do for your health. Most adults should:  Exercise for at least 150 minutes each week. The exercise should increase your heart rate and make you sweat (moderate-intensity exercise).  Do strengthening exercises at least twice a week. This is in addition to the moderate-intensity exercise.  Spend less time sitting. Even light physical activity can be beneficial. Watch cholesterol and blood lipids Have your blood tested for lipids and cholesterol at 59 years of age, then have this test every 5 years. You may need to have your cholesterol levels checked more often if:  Your lipid or cholesterol levels are high.  You are older than 59 years of age.  You are at high risk for heart disease. What should I know about cancer screening? Many  types of cancers can be detected early and may often be prevented. Depending on your health history and family history, you may need to have cancer screening at various ages. This may include screening for:  Colorectal cancer.  Prostate cancer.  Skin cancer.  Lung cancer. What should I know about heart disease, diabetes, and high blood pressure? Blood pressure and heart disease  High blood pressure causes heart disease and increases the risk of stroke. This is more likely to develop in people who have high blood pressure readings, are of African descent, or are overweight.  Talk with your health care provider about your target blood pressure readings.  Have your blood pressure checked: ? Every 3-5 years if you are 82-56 years of age. ? Every year if you are 37 years old or older.  If you are between the ages of 92 and 43 and are a current or former smoker, ask your health care provider if you should have a one-time screening for abdominal aortic aneurysm (AAA). Diabetes Have regular diabetes screenings. This checks your fasting blood sugar level. Have the screening done:  Once every three years after age 22 if you are at a normal weight and have a low risk for diabetes.  More often and at a younger age if you are overweight or have a high risk for diabetes. What should I know about preventing infection? Hepatitis B If you have a higher risk for hepatitis B, you should be screened for this virus. Talk with your health care provider to find out if you are at risk for hepatitis B infection. Hepatitis C Blood testing is recommended for:  Everyone born from [birth year] through 1965.  Anyone with known risk factors for hepatitis C. Sexually transmitted infections (STIs)  You should be screened each year for STIs, including gonorrhea and chlamydia, if: ? You are sexually active and are younger than 59 years of age. ? You are older than 59 years of age and your health care provider tells you  that you are at risk for this type of infection. ? Your sexual activity has changed since you were last screened, and you are at increased risk for chlamydia or gonorrhea. Ask your health care provider if you are at risk.  Ask your health care provider about whether you are at high risk for HIV. Your health care provider may recommend a prescription medicine to help prevent HIV infection. If you choose to take medicine to prevent HIV, you should first get tested for HIV. You should then be tested every 3 months for as long as you are taking the medicine. Follow these instructions at home:  Lifestyle  Do not use any products that contain nicotine or tobacco, such as cigarettes, e-cigarettes, and chewing tobacco. If you need help quitting, ask your health care provider.  Do not use street drugs.  Do not share needles.  Ask your health care provider for help if you need support or information about quitting drugs. Alcohol use  Do not drink alcohol if your health care provider tells you not to drink.  If you drink alcohol: ? Limit how much you have to 0-2 drinks a day. ? Be aware of how much alcohol is in your drink. In the [place] one drink equals one 12 oz bottle of beer (355 mL), one 5 oz glass of wine (148 mL), or one 1 oz glass of hard liquor (44 mL). General instructions  Schedule regular health, dental, and eye exams.  Stay current with your vaccines.  Tell your health care provider if: ? You often feel depressed. ? You have ever been abused or do not feel safe at home. Summary  Adopting a healthy lifestyle and getting preventive care are important in promoting health and wellness.  Follow your health care provider's instructions about healthy diet, exercising, and getting tested or screened for diseases.  Follow your health care provider's instructions on monitoring your cholesterol and blood pressure. This information is not intended to replace advice given to you by your  health care provider. Make sure you discuss any questions you have with your health care provider. Document Revised: 08/04/2018 Document Reviewed: 08/04/2018 Elsevier Patient Education  2020 [employer].

## 2020-03-12 NOTE — Progress Notes (Signed)
Subjective:     Patient ID: Billy Foster, male   DOB: 05-18-1961, 59 y.o.   MRN: [id number]  HPI  Billy Foster is a 59 y.o. male employed by [employer] in the Department of [employer]. He has been working there for 19 years. He has a stressful job  but it is fulfilling to help people in need. Employee currently taking Doxycycline and using Bactroban prescribed by dermatologist for Rosacea.  PMH: OAS-CPAP, Bradycardia, Tubular adenoma, Prostatic hyperplasia, Rosacea    SH: denies use of tobacco, ETOH, or drug use  Meds: Doxycycline, Bactroban Allergies Neosporin, Polysporin, Tixocortol, hydrocortisone.  Immunizations: UTD, has had both doses of Covid Vaccine.  Diet and exercise: Employee reports eating healthy and ridding his bike 30 minutes 5 times a week.   Review of Systems  Respiratory:       Bradycardia   Skin: Positive for rash.  All other systems reviewed and are negative.      Objective: BP 122/74 (BP Location: Right Arm, Patient Position: Sitting, Cuff Size: Normal)   Pulse (!) 46   Temp (!) 97.3 F (36.3 C) (Temporal)   Resp 16   Ht 5\' 11"  (1.803 m)   Wt 200 lb (90.7 kg)   SpO2 97%   BMI 27.89 kg/m     Physical Exam Vitals and nursing note reviewed.  Constitutional:      General: He is not in acute distress.    Appearance: Normal appearance.  HENT:     Head: Normocephalic.     Right Ear: Tympanic membrane and ear canal normal.     Left Ear: Tympanic membrane and ear canal normal.     Nose: Nose normal.     Mouth/Throat:     Mouth: Mucous membranes are moist.     Pharynx: Oropharynx is clear.  Eyes:     Extraocular Movements: Extraocular movements intact.     Conjunctiva/sclera: Conjunctivae normal.  Neck:     Vascular: No carotid bruit.     Trachea: Trachea normal.  Cardiovascular:     Rate and Rhythm: Regular rhythm. Bradycardia present.     Pulses: Normal pulses.  Pulmonary:     Effort: Pulmonary effort is normal.     Breath sounds: Normal breath  sounds.  Abdominal:     Palpations: Abdomen is soft.     Tenderness: There is no abdominal tenderness. There is no right CVA tenderness or left CVA tenderness.  Musculoskeletal:     Cervical back: Normal range of motion. No tenderness.  Skin:    General: Skin is warm and dry.     Comments: Mild erythema of face.  Neurological:     Mental Status: He is alert.     Motor: No weakness.     Coordination: Romberg sign negative.     Gait: Gait normal.     Deep Tendon Reflexes:     Reflex Scores:      Bicep reflexes are 2+ on the right side and 2+ on the left side.      Brachioradialis reflexes are 2+ on the right side and 2+ on the left side.      Patellar reflexes are 2+ on the right side and 2+ on the left side.    Comments: Ambulatory with steady gait, stands on one foot without difficulty. Radial pulses 2+, grips are equal.   Psychiatric:        Mood and Affect: Mood normal.        Behavior:  Behavior normal.        Assessment:    59 y.o. male here for his annual biometric screening exam. Limited physical exam with mild Rosacea noted and Bradycardia.      Plan:    Discussed with employee healthy lifestyle with diet and exercise. Discussed continued yearly f/u with Urology for Prostate evaluation and yearly f/u with cardiology or PCP for Bradycardia. F/u with dermatology as needed for Rosacea. RTC in one year or as needed. Instructions given verbally and in writing. Employee given opportunity to ask questions. All questions answered. Employee voices understanding.

## 2020-03-13 LAB — LIPID PANEL
Chol/HDL Ratio: 4.5 ratio (ref 0.0–5.0)
Cholesterol, Total: 183 mg/dL (ref 100–199)
HDL: 41 mg/dL (ref >39)
LDL Chol Calc (NIH): 130 mg/dL — ABNORMAL HIGH (ref 0–99)
Triglycerides: 62 mg/dL (ref 0–149)
VLDL Cholesterol Cal: 12 mg/dL (ref 5–40)

## 2020-03-13 LAB — GLUCOSE, RANDOM: Glucose: 92 mg/dL (ref 65–99)

## 2020-04-12 ENCOUNTER — Ambulatory Visit: Payer: Managed Care, Other (non HMO) | Admitting: Dermatology

## 2020-04-19 ENCOUNTER — Ambulatory Visit (INDEPENDENT_AMBULATORY_CARE_PROVIDER_SITE_OTHER): Payer: Managed Care, Other (non HMO) | Admitting: Dermatology

## 2020-04-19 ENCOUNTER — Other Ambulatory Visit: Payer: Self-pay

## 2020-04-19 DIAGNOSIS — L719 Rosacea, unspecified: Secondary | ICD-10-CM | POA: Diagnosis not present

## 2020-04-19 DIAGNOSIS — L239 Allergic contact dermatitis, unspecified cause: Secondary | ICD-10-CM | POA: Diagnosis not present

## 2020-04-19 NOTE — Progress Notes (Signed)
   Follow-Up Visit   Subjective  Billy Foster is a 59 y.o. male who presents for the following: Follow-up (f/u for allergic contact dermatitis and rosacea).  The following portions of the chart were reviewed this encounter and updated as appropriate: Tobacco  Allergies  Meds  Problems  Med Hx  Surg Hx  Fam Hx     Review of Systems: No other skin or systemic complaints except as noted in HPI or Assessment and Plan.   Objective  Well appearing patient in no apparent distress; mood and affect are within normal limits.  A focused examination was performed including lower extremities, including the legs, feet, toes, and toenails and face. Relevant physical exam findings are noted in the Assessment and Plan.  Objective  Head - Anterior (Face): Mid face erythema  Objective  periocular: Clear.  Assessment & Plan  Rosacea Head - Anterior (Face)  Pt advised that his is a chronic progressive condition.   If consists, start Doxycycline 100 mg, already have refill at pharmacy. Advised to call if another needed before next follow up.   Advised to use rosacea medication on face as needed. Do not use on eyelids.  Other Related Medications doxycycline (VIBRAMYCIN) 100 MG capsule  Allergic contact dermatitis, unspecified trigger periocular  Observe. Avoid offending agents.  Use Eucrisa for eyelids as needed.   Pt reports that he has been using fragrance free detergent, soap, lotion, and shampoo.  Advised to stop using Mometasone and mupirocin.   Other Related Medications Crisaborole (EUCRISA) 2 % OINT mupirocin ointment (BACTROBAN) 2 %  Return in about 6 months (around 10/20/2020) for annual exam.   I, Harriett Sine, CMA, am acting as scribe for Sarina Ser, MD.  Documentation: I have reviewed the above documentation for accuracy and completeness, and I agree with the above.  Sarina Ser, MD

## 2020-04-27 ENCOUNTER — Encounter: Payer: Self-pay | Admitting: Dermatology

## 2020-06-05 ENCOUNTER — Other Ambulatory Visit: Payer: Self-pay | Admitting: Dermatology

## 2020-06-05 DIAGNOSIS — L719 Rosacea, unspecified: Secondary | ICD-10-CM

## 2020-10-04 ENCOUNTER — Ambulatory Visit (INDEPENDENT_AMBULATORY_CARE_PROVIDER_SITE_OTHER): Payer: Managed Care, Other (non HMO) | Admitting: Dermatology

## 2020-10-04 ENCOUNTER — Other Ambulatory Visit: Payer: Self-pay

## 2020-10-04 DIAGNOSIS — L821 Other seborrheic keratosis: Secondary | ICD-10-CM

## 2020-10-04 DIAGNOSIS — D235 Other benign neoplasm of skin of trunk: Secondary | ICD-10-CM | POA: Diagnosis not present

## 2020-10-04 DIAGNOSIS — D239 Other benign neoplasm of skin, unspecified: Secondary | ICD-10-CM

## 2020-10-04 DIAGNOSIS — L719 Rosacea, unspecified: Secondary | ICD-10-CM | POA: Diagnosis not present

## 2020-10-04 DIAGNOSIS — D229 Melanocytic nevi, unspecified: Secondary | ICD-10-CM

## 2020-10-04 DIAGNOSIS — L239 Allergic contact dermatitis, unspecified cause: Secondary | ICD-10-CM

## 2020-10-04 DIAGNOSIS — D1800 Hemangioma unspecified site: Secondary | ICD-10-CM

## 2020-10-04 DIAGNOSIS — Z86018 Personal history of other benign neoplasm: Secondary | ICD-10-CM | POA: Diagnosis not present

## 2020-10-04 DIAGNOSIS — Z1283 Encounter for screening for malignant neoplasm of skin: Secondary | ICD-10-CM

## 2020-10-04 DIAGNOSIS — L578 Other skin changes due to chronic exposure to nonionizing radiation: Secondary | ICD-10-CM

## 2020-10-04 DIAGNOSIS — I781 Nevus, non-neoplastic: Secondary | ICD-10-CM

## 2020-10-04 DIAGNOSIS — L814 Other melanin hyperpigmentation: Secondary | ICD-10-CM

## 2020-10-04 NOTE — Progress Notes (Signed)
Follow-Up Visit   Subjective  Billy Foster is a 60 y.o. male who presents for the following: Total body skin exam (Hx of Dysplastic nevus), Rosacea (Face, pt d/c Doxycycline didn't help, using otc Prosacea), and hx of ACD (Periocular, clear, not using ). The patient presents for Total-Body Skin Exam (TBSE) for skin cancer screening and mole check.  The following portions of the chart were reviewed this encounter and updated as appropriate:   Tobacco  Allergies  Meds  Problems  Med Hx  Surg Hx  Fam Hx     Review of Systems:  No other skin or systemic complaints except as noted in HPI or Assessment and Plan.  Objective  Well appearing patient in no apparent distress; mood and affect are within normal limits.  A full examination was performed including scalp, head, eyes, ears, nose, lips, neck, chest, axillae, abdomen, back, buttocks, bilateral upper extremities, bilateral lower extremities, hands, feet, fingers, toes, fingernails, and toenails. All findings within normal limits unless otherwise noted below.  Objective  R top of shoulder, L ant waistline: Scar with no evidence of recurrence.   Objective  face: Mild pinkness cheeks  Objective  Left scapula: Firm pink/brown papulenodule with dimple sign.   Objective  bil lower legs: Telangiectasias bil lower legs  Objective  bil eyelids: Clear today   Assessment & Plan    Lentigines - Scattered tan macules - Discussed due to sun exposure - Benign, observe - Call for any changes  Seborrheic Keratoses - Stuck-on, waxy, tan-brown papules and plaques  - Discussed benign etiology and prognosis. - Observe - Call for any changes  Melanocytic Nevi - Tan-brown and/or pink-flesh-colored symmetric macules and papules - Benign appearing on exam today - Observation - Call clinic for new or changing moles - Recommend daily use of broad spectrum spf 30+ sunscreen to sun-exposed areas.   Hemangiomas - Red  papules - Discussed benign nature - Observe - Call for any changes  Actinic Damage - Chronic, secondary to cumulative UV/sun exposure - diffuse scaly erythematous macules with underlying dyspigmentation - Recommend daily broad spectrum sunscreen SPF 30+ to sun-exposed areas, reapply every 2 hours as needed.  - Call for new or changing lesions.  Skin cancer screening performed today.  History of dysplastic nevus R top of shoulder, L ant waistline Clear. Observe for recurrence. Call clinic for new or changing lesions.  Recommend regular skin exams, daily broad-spectrum spf 30+ sunscreen use, and photoprotection.     Rosacea face Rosacea is a chronic progressive skin condition usually affecting the face of adults, causing redness and/or acne bumps. It is treatable but not curable. It sometimes affects the eyes (ocular rosacea) as well. It may respond to topical and/or systemic medication and can flare with stress, sun exposure, alcohol, exercise and some foods.  Daily application of broad spectrum spf 30+ sunscreen to face is recommended to reduce flares.  D/c Doxycycline (pt didn't feel it helped) Consider other treatment options such as topicals in the future if he desires.  Other Related Medications doxycycline (VIBRAMYCIN) 100 MG capsule  Dermatofibroma Left scapula Benign, observe.    Spider veins bil lower legs Benign.  Observe.  Discussed injection options.  Allergic contact dermatitis -chronic; persistent, but controlled with avoidance bil eyelids Hx of Allergic Contact Dermatitis with positive patch to the following: #23Thimerosal  #33 Bacitracin #27 Tixocortol #21-Pivalate #3 Neomycin #6 Fragrance Mix  #28 Gold Sodium Thiosulfate  Resolved with avoidance of allergens. Cont Eucrisa oint prn  flares  Other Related Medications Crisaborole (EUCRISA) 2 % OINT mupirocin ointment (BACTROBAN) 2 %  Return in about 1 year (around 10/04/2021) for TBSE, Hx of Dysplastic  nevi.   I, Othelia Pulling, RMA, am acting as scribe for Sarina Ser, MD .  Documentation: I have reviewed the above documentation for accuracy and completeness, and I agree with the above.  Sarina Ser, MD

## 2020-10-07 ENCOUNTER — Encounter: Payer: Self-pay | Admitting: Dermatology

## 2020-12-10 ENCOUNTER — Other Ambulatory Visit: Payer: Self-pay

## 2020-12-10 ENCOUNTER — Other Ambulatory Visit: Payer: Managed Care, Other (non HMO)

## 2020-12-10 DIAGNOSIS — Z125 Encounter for screening for malignant neoplasm of prostate: Secondary | ICD-10-CM | POA: Diagnosis not present

## 2020-12-10 DIAGNOSIS — N138 Other obstructive and reflux uropathy: Secondary | ICD-10-CM

## 2020-12-10 DIAGNOSIS — R972 Elevated prostate specific antigen [PSA]: Secondary | ICD-10-CM

## 2020-12-10 DIAGNOSIS — N401 Enlarged prostate with lower urinary tract symptoms: Secondary | ICD-10-CM

## 2020-12-11 LAB — PSA: Prostate Specific Ag, Serum: 4.2 ng/mL — ABNORMAL HIGH (ref 0.0–4.0)

## 2021-04-17 ENCOUNTER — Other Ambulatory Visit: Payer: Managed Care, Other (non HMO)

## 2021-04-17 ENCOUNTER — Other Ambulatory Visit: Payer: Self-pay

## 2021-04-17 VITALS — BP 110/68 | HR 48 | Temp 98.0°F | Resp 16 | Ht 70.0 in | Wt 195.0 lb

## 2021-04-17 DIAGNOSIS — Z008 Encounter for other general examination: Secondary | ICD-10-CM | POA: Diagnosis not present

## 2021-04-18 LAB — LIPID PANEL
Chol/HDL Ratio: 4.8 ratio (ref 0.0–5.0)
Cholesterol, Total: 191 mg/dL (ref 100–199)
HDL: 40 mg/dL (ref >39)
LDL Chol Calc (NIH): 139 mg/dL — ABNORMAL HIGH (ref 0–99)
Triglycerides: 66 mg/dL (ref 0–149)
VLDL Cholesterol Cal: 12 mg/dL (ref 5–40)

## 2021-04-18 LAB — GLUCOSE, RANDOM: Glucose: 97 mg/dL (ref 65–99)

## 2021-10-10 ENCOUNTER — Ambulatory Visit (INDEPENDENT_AMBULATORY_CARE_PROVIDER_SITE_OTHER): Payer: Managed Care, Other (non HMO) | Admitting: Dermatology

## 2021-10-10 ENCOUNTER — Other Ambulatory Visit: Payer: Self-pay

## 2021-10-10 DIAGNOSIS — L578 Other skin changes due to chronic exposure to nonionizing radiation: Secondary | ICD-10-CM | POA: Diagnosis not present

## 2021-10-10 DIAGNOSIS — L230 Allergic contact dermatitis due to metals: Secondary | ICD-10-CM | POA: Diagnosis not present

## 2021-10-10 DIAGNOSIS — L918 Other hypertrophic disorders of the skin: Secondary | ICD-10-CM

## 2021-10-10 DIAGNOSIS — Z86018 Personal history of other benign neoplasm: Secondary | ICD-10-CM

## 2021-10-10 DIAGNOSIS — L821 Other seborrheic keratosis: Secondary | ICD-10-CM

## 2021-10-10 DIAGNOSIS — L814 Other melanin hyperpigmentation: Secondary | ICD-10-CM

## 2021-10-10 DIAGNOSIS — D1800 Hemangioma unspecified site: Secondary | ICD-10-CM

## 2021-10-10 DIAGNOSIS — L719 Rosacea, unspecified: Secondary | ICD-10-CM

## 2021-10-10 DIAGNOSIS — D229 Melanocytic nevi, unspecified: Secondary | ICD-10-CM

## 2021-10-10 DIAGNOSIS — Z1283 Encounter for screening for malignant neoplasm of skin: Secondary | ICD-10-CM

## 2021-10-10 DIAGNOSIS — I8393 Asymptomatic varicose veins of bilateral lower extremities: Secondary | ICD-10-CM

## 2021-10-10 DIAGNOSIS — D2361 Other benign neoplasm of skin of right upper limb, including shoulder: Secondary | ICD-10-CM

## 2021-10-10 DIAGNOSIS — D235 Other benign neoplasm of skin of trunk: Secondary | ICD-10-CM

## 2021-10-10 NOTE — Patient Instructions (Addendum)
Melanoma ABCDEs ? ?Melanoma is the most dangerous type of skin cancer, and is the leading cause of death from skin disease.  You are more likely to develop melanoma if you: ?Have light-colored skin, light-colored eyes, or red or blond hair ?Spend a lot of time in the sun ?Tan regularly, either outdoors or in a tanning bed ?Have had blistering sunburns, especially during childhood ?Have a close family member who has had a melanoma ?Have atypical moles or large birthmarks ? ?Early detection of melanoma is key since treatment is typically straightforward and cure rates are extremely high if we catch it early.  ? ?The first sign of melanoma is often a change in a mole or a new dark spot.  The ABCDE system is a way of remembering the signs of melanoma. ? ?A for asymmetry:  The two halves do not match. ?B for border:  The edges of the growth are irregular. ?C for color:  A mixture of colors are present instead of an even brown color. ?D for diameter:  Melanomas are usually (but not always) greater than 6mm - the size of a pencil eraser. ?E for evolution:  The spot keeps changing in size, shape, and color. ? ?Please check your skin once per month between visits. You can use a small mirror in front and a large mirror behind you to keep an eye on the back side or your body.  ? ?If you see any new or changing lesions before your next follow-up, please call to schedule a visit. ? ?Please continue daily skin protection including broad spectrum sunscreen SPF 30+ to sun-exposed areas, reapplying every 2 hours as needed when you're outdoors.  ? ?Staying in the shade or wearing long sleeves, sun glasses (UVA+UVB protection) and wide brim hats (4-inch brim around the entire circumference of the hat) are also recommended for sun protection.   ? ? ?If You Need Anything After Your Visit ? ?If you have any questions or concerns for your doctor, please call our main line at [phone] and press option 4 to reach your doctor's medical  assistant. If no one answers, please leave a voicemail as directed and we will return your call as soon as possible. Messages left after 4 pm will be answered the following business day.  ? ?You may also send us a message via MyChart. We typically respond to MyChart messages within 1-2 business days. ? ?For prescription refills, please ask your pharmacy to contact our office. Our fax number is [phone]. ? ?If you have an urgent issue when the clinic is closed that cannot wait until the next business day, you can page your doctor at the number below.   ? ?Please note that while we do our best to be available for urgent issues outside of office hours, we are not available 24/7.  ? ?If you have an urgent issue and are unable to reach us, you may choose to seek medical care at your doctor's office, retail clinic, urgent care center, or emergency room. ? ?If you have a medical emergency, please immediately call 911 or go to the emergency department. ? ?Pager Numbers ? ?- Dr. Kowalski: [phone] ? ?- Dr. Moye: [phone] ? ?- Dr. Stewart: [phone] ? ?In the event of inclement weather, please call our main line at [phone] for an update on the status of any delays or closures. ? ?Dermatology Medication Tips: ?Please keep the boxes that topical medications come in in order to help keep track of the instructions   about where and how to use these. Pharmacies typically print the medication instructions only on the boxes and not directly on the medication tubes.  ? ?If your medication is too expensive, please contact our office at [phone] option 4 or send us a message through MyChart.  ? ?We are unable to tell what your co-pay for medications will be in advance as this is different depending on your insurance coverage. However, we may be able to find a substitute medication at lower cost or fill out paperwork to get insurance to cover a needed medication.  ? ?If a prior authorization is required to get your  medication covered by your insurance company, please allow us 1-2 business days to complete this process. ? ?Drug prices often vary depending on where the prescription is filled and some pharmacies may offer cheaper prices. ? ?The website www.goodrx.com contains coupons for medications through different pharmacies. The prices here do not account for what the cost may be with help from insurance (it may be cheaper with your insurance), but the website can give you the price if you did not use any insurance.  ?- You can print the associated coupon and take it with your prescription to the pharmacy.  ?- You may also stop by our office during regular business hours and pick up a GoodRx coupon card.  ?- If you need your prescription sent electronically to a different pharmacy, notify our office through [facility] MyChart or by phone at [phone] option 4. ? ? ? ? ?Si Usted Necesita Algo Despu?s de Su Visita ? ?Tambi?n puede enviarnos un mensaje a trav?s de MyChart. Por lo general respondemos a los mensajes de MyChart en el transcurso de 1 a 2 d?as h?biles. ? ?Para renovar recetas, por favor pida a su farmacia que se ponga en contacto con nuestra oficina. Nuestro n?mero de fax es el [phone]. ? ?Si tiene un asunto urgente cuando la cl?nica est? cerrada y que no puede esperar hasta el siguiente d?a h?bil, puede llamar/localizar a su doctor(a) al n?mero que aparece a continuaci?n.  ? ?Por favor, tenga en cuenta que aunque hacemos todo lo posible para estar disponibles para asuntos urgentes fuera del horario de oficina, no estamos disponibles las 24 horas del d?a, los 7 d?as de la semana.  ? ?Si tiene un problema urgente y no puede comunicarse con nosotros, puede optar por buscar atenci?n m?dica  en el consultorio de su doctor(a), en una cl?nica privada, en un centro de atenci?n urgente o en una sala de emergencias. ? ?Si tiene una emergencia m?dica, por favor llame inmediatamente al 911 o vaya a la sala de  emergencias. ? ?N?meros de b?per ? ?- Dr. Kowalski: [phone] ? ?- Dra. Moye: [phone] ? ?- Dra. Stewart: [phone] ? ?En caso de inclemencias del tiempo, por favor llame a nuestra l?nea principal al [phone] para una actualizaci?n sobre el estado de cualquier retraso o cierre. ? ?Consejos para la medicaci?n en dermatolog?a: ?Por favor, guarde las cajas en las que vienen los medicamentos de uso t?pico para ayudarle a seguir las instrucciones sobre d?nde y c?mo usarlos. Las farmacias generalmente imprimen las instrucciones del medicamento s?lo en las cajas y no directamente en los tubos del medicamento.  ? ?Si su medicamento es muy caro, por favor, p?ngase en contacto con nuestra oficina llamando al [phone] y presione la opci?n 4 o env?enos un mensaje a trav?s de MyChart.  ? ?No podemos decirle cu?l ser? su copago por los medicamentos por adelantado ya que   esto es diferente dependiendo de la cobertura de su seguro. Sin embargo, es posible que podamos encontrar un medicamento sustituto a menor costo o llenar un formulario para que el seguro cubra el medicamento que se considera necesario.  ? ?Si se requiere una autorizaci?n previa para que su compa??a de seguros cubra su medicamento, por favor perm?tanos de 1 a 2 d?as h?biles para completar este proceso. ? ?Los precios de los medicamentos var?an con frecuencia dependiendo del lugar de d?nde se surte la receta y alguna farmacias pueden ofrecer precios m?s baratos. ? ?El sitio web www.goodrx.com tiene cupones para medicamentos de diferentes farmacias. Los precios aqu? no tienen en cuenta lo que podr?a costar con la ayuda del seguro (puede ser m?s barato con su seguro), pero el sitio web puede darle el precio si no utiliz? ning?n seguro.  ?- Puede imprimir el cup?n correspondiente y llevarlo con su receta a la farmacia.  ?- Tambi?n puede pasar por nuestra oficina durante el horario de atenci?n regular y recoger una tarjeta de cupones de GoodRx.  ?- Si  necesita que su receta se env?e electr?nicamente a una farmacia diferente, informe a nuestra oficina a trav?s de MyChart de  o por tel?fono llamando al [phone] y presione la opci?n 4. ? ?

## 2021-10-10 NOTE — Progress Notes (Signed)
Follow-Up Visit   Subjective  Billy Foster is a 61 y.o. male who presents for the following: Follow-up (Patient here today for 1 year tbse. Patient has history of rosacea. ). The patient presents for Total-Body Skin Exam (TBSE) for skin cancer screening and mole check.  The patient has spots, moles and lesions to be evaluated, some may be new or changing and the patient has concerns that these could be cancer.  The following portions of the chart were reviewed this encounter and updated as appropriate:  Tobacco   Allergies   Meds   Problems   Med Hx   Surg Hx   Fam Hx      Review of Systems: No other skin or systemic complaints except as noted in HPI or Assessment and Plan.  Objective  Well appearing patient in no apparent distress; mood and affect are within normal limits.  A full examination was performed including scalp, head, eyes, ears, nose, lips, neck, chest, axillae, abdomen, back, buttocks, bilateral upper extremities, bilateral lower extremities, hands, feet, fingers, toes, fingernails, and toenails. All findings within normal limits unless otherwise noted below.  Head - Anterior (Face) Controlled   back Pink patch at back        Assessment & Plan  Rosacea Head - Anterior (Face) Rosacea is a chronic progressive skin condition usually affecting the face of adults, causing redness and/or acne bumps. It is treatable but not curable. It sometimes affects the eyes (ocular rosacea) as well. It may respond to topical and/or systemic medication and can flare with stress, sun exposure, alcohol, exercise and some foods.  Daily application of broad spectrum spf 30+ sunscreen to face is recommended to reduce flares.  Patient defers treatment at this time.   Allergic contact dermatitis due to metals Back See photo Patient here today with still positive reaction at back  history of allergy testing chronic; persistent, but controlled with avoidance -avoid any gold touching  skin. Patient had a positive reaction to #28 Gold Sodium Thiosulfate  Lentigines - Scattered tan macules - Due to sun exposure - Benign-appearing, observe - Recommend daily broad spectrum sunscreen SPF 30+ to sun-exposed areas, reapply every 2 hours as needed. - Call for any changes  Acrochordons (Skin Tags) - Fleshy, skin-colored pedunculated papules - Benign appearing.  - Observe. - If desired, they can be removed with an in office procedure that is not covered by insurance. - Please call the clinic if you notice any new or changing lesions.  Seborrheic Keratoses - Stuck-on, waxy, tan-brown papules and/or plaques  - Benign-appearing - Discussed benign etiology and prognosis. - Observe - Call for any changes  Melanocytic Nevi - Tan-brown and/or pink-flesh-colored symmetric macules and papules - Benign appearing on exam today - Observation - Call clinic for new or changing moles - Recommend daily use of broad spectrum spf 30+ sunscreen to sun-exposed areas.   Dermatofibroma - Firm pink/brown papulenodule with dimple sign right dorsum hand and left scapula  - Benign appearing - Call for any changes  Varicose Veins/Spider Veins - Dilated blue, purple or red veins at the lower extremities - Reassured - Smaller vessels can be treated by sclerotherapy (a procedure to inject a medicine into the veins to make them disappear) if desired, but the treatment is not covered by insurance. Larger vessels may be covered if symptomatic and we would refer to vascular surgeon if treatment desired.  Hemangiomas - Red papules - Discussed benign nature - Observe - Call for  any changes  Actinic Damage - Chronic condition, secondary to cumulative UV/sun exposure - diffuse scaly erythematous macules with underlying dyspigmentation - Recommend daily broad spectrum sunscreen SPF 30+ to sun-exposed areas, reapply every 2 hours as needed.  - Staying in the shade or wearing long sleeves, sun  glasses (UVA+UVB protection) and wide brim hats (4-inch brim around the entire circumference of the hat) are also recommended for sun protection.  - Call for new or changing lesions.  History of Dysplastic Nevi - No evidence of recurrence today right top of shoulder and left ant waistline  - Recommend regular full body skin exams - Recommend daily broad spectrum sunscreen SPF 30+ to sun-exposed areas, reapply every 2 hours as needed.  - Call if any new or changing lesions are noted between office visits  Skin cancer screening performed today.  Return for 1 year tbse. IRuthell Rummage, CMA, am acting as scribe for Sarina Ser, MD. Documentation: I have reviewed the above documentation for accuracy and completeness, and I agree with the above.  Sarina Ser, MD

## 2021-10-19 ENCOUNTER — Encounter: Payer: Self-pay | Admitting: Dermatology

## 2022-10-14 ENCOUNTER — Ambulatory Visit: Payer: Managed Care, Other (non HMO) | Admitting: Dermatology

## 2022-11-10 ENCOUNTER — Ambulatory Visit: Payer: Managed Care, Other (non HMO) | Admitting: Dermatology

## 2022-11-27 ENCOUNTER — Ambulatory Visit (INDEPENDENT_AMBULATORY_CARE_PROVIDER_SITE_OTHER): Payer: Managed Care, Other (non HMO) | Admitting: Dermatology

## 2022-11-27 VITALS — BP 137/85

## 2022-11-27 DIAGNOSIS — Z86018 Personal history of other benign neoplasm: Secondary | ICD-10-CM

## 2022-11-27 DIAGNOSIS — L821 Other seborrheic keratosis: Secondary | ICD-10-CM

## 2022-11-27 DIAGNOSIS — D1801 Hemangioma of skin and subcutaneous tissue: Secondary | ICD-10-CM

## 2022-11-27 DIAGNOSIS — D229 Melanocytic nevi, unspecified: Secondary | ICD-10-CM

## 2022-11-27 DIAGNOSIS — L814 Other melanin hyperpigmentation: Secondary | ICD-10-CM

## 2022-11-27 DIAGNOSIS — L578 Other skin changes due to chronic exposure to nonionizing radiation: Secondary | ICD-10-CM

## 2022-11-27 DIAGNOSIS — Z1283 Encounter for screening for malignant neoplasm of skin: Secondary | ICD-10-CM

## 2022-11-27 DIAGNOSIS — Z872 Personal history of diseases of the skin and subcutaneous tissue: Secondary | ICD-10-CM

## 2022-11-27 DIAGNOSIS — L918 Other hypertrophic disorders of the skin: Secondary | ICD-10-CM

## 2022-11-27 NOTE — Progress Notes (Signed)
   Follow-Up Visit   Subjective  Billy Foster is a 62 y.o. male who presents for the following: Skin Cancer Screening and Full Body Skin Exam The patient presents for Total-Body Skin Exam (TBSE) for skin cancer screening and mole check. The patient has spots, moles and lesions to be evaluated, some may be new or changing and the patient has concerns that these could be cancer.  The following portions of the chart were reviewed this encounter and updated as appropriate: medications, allergies, medical history  Review of Systems:  No other skin or systemic complaints except as noted in HPI or Assessment and Plan.  Objective  Well appearing patient in no apparent distress; mood and affect are within normal limits.  A full examination was performed including scalp, head, eyes, ears, nose, lips, neck, chest, axillae, abdomen, back, buttocks, bilateral upper extremities, bilateral lower extremities, hands, feet, fingers, toes, fingernails, and toenails. All findings within normal limits unless otherwise noted below.   Relevant physical exam findings are noted in the Assessment and Plan.   Assessment & Plan   History of Dysplastic Nevi - No evidence of recurrence today - Recommend regular full body skin exams - Recommend daily broad spectrum sunscreen SPF 30+ to sun-exposed areas, reapply every 2 hours as needed.  - Call if any new or changing lesions are noted between office visits  Acrochordons (Skin Tags) - Fleshy, skin-colored pedunculated papules - Benign appearing.  - Observe. - If desired, they can be removed with an in office procedure that is not covered by insurance. - Please call the clinic if you notice any new or changing lesions.   LENTIGINES, SEBORRHEIC KERATOSES, HEMANGIOMAS - Benign normal skin lesions - Benign-appearing - Call for any changes  MELANOCYTIC NEVI - Tan-brown and/or pink-flesh-colored symmetric macules and papules - Benign appearing on exam today -  Observation - Call clinic for new or changing moles - Recommend daily use of broad spectrum spf 30+ sunscreen to sun-exposed areas.   ACTINIC DAMAGE - Chronic condition, secondary to cumulative UV/sun exposure - diffuse scaly erythematous macules with underlying dyspigmentation - Recommend daily broad spectrum sunscreen SPF 30+ to sun-exposed areas, reapply every 2 hours as needed.  - Staying in the shade or wearing long sleeves, sun glasses (UVA+UVB protection) and wide brim hats (4-inch brim around the entire circumference of the hat) are also recommended for sun protection.  - Call for new or changing lesions.  SKIN CANCER SCREENING PERFORMED TODAY.  History of eyelid Dermatitis - Clear today.  Contact dermatitis -likely cause of eyelid dermatitis with positive patch test as below He has a pink patch in the area of +Gold reaction on his right upper back but it not bothersome. He had patch testing in 2021 with positive reactions to #3 - neomycin, #6 - fragrance mix, #23 - Thimerosal, #27 - Tixocortol-21-pivalate, #28 - Gold Sodium Thiosulfate, #33 - Bacitracin   Return in about 1 year (around 11/27/2023) for TBSE.  I, Joanie Coddington, CMA, am acting as scribe for Armida Sans, MD .  Documentation: I have reviewed the above documentation for accuracy and completeness, and I agree with the above.  Armida Sans, MD

## 2022-11-27 NOTE — Patient Instructions (Signed)
Due to recent changes in healthcare laws, you may see results of your pathology and/or laboratory studies on MyChart before the doctors have had a chance to review them. We understand that in some cases there may be results that are confusing or concerning to you. Please understand that not all results are received at the same time and often the doctors may need to interpret multiple results in order to provide you with the best plan of care or course of treatment. Therefore, we ask that you please give us 2 business days to thoroughly review all your results before contacting the office for clarification. Should we see a critical lab result, you will be contacted sooner.   If You Need Anything After Your Visit  If you have any questions or concerns for your doctor, please call our main line at [phone] and press option 4 to reach your doctor's medical assistant. If no one answers, please leave a voicemail as directed and we will return your call as soon as possible. Messages left after 4 pm will be answered the following business day.   You may also send us a message via MyChart. We typically respond to MyChart messages within 1-2 business days.  For prescription refills, please ask your pharmacy to contact our office. Our fax number is [phone].  If you have an urgent issue when the clinic is closed that cannot wait until the next business day, you can page your doctor at the number below.    Please note that while we do our best to be available for urgent issues outside of office hours, we are not available 24/7.   If you have an urgent issue and are unable to reach us, you may choose to seek medical care at your doctor's office, retail clinic, urgent care center, or emergency room.  If you have a medical emergency, please immediately call 911 or go to the emergency department.  Pager Numbers  - Dr. Kowalski: [phone]  - Dr. Moye: [phone]  - Dr. Stewart:  [phone]  In the event of inclement weather, please call our main line at [phone] for an update on the status of any delays or closures.  Dermatology Medication Tips: Please keep the boxes that topical medications come in in order to help keep track of the instructions about where and how to use these. Pharmacies typically print the medication instructions only on the boxes and not directly on the medication tubes.   If your medication is too expensive, please contact our office at [phone] option 4 or send us a message through MyChart.   We are unable to tell what your co-pay for medications will be in advance as this is different depending on your insurance coverage. However, we may be able to find a substitute medication at lower cost or fill out paperwork to get insurance to cover a needed medication.   If a prior authorization is required to get your medication covered by your insurance company, please allow us 1-2 business days to complete this process.  Drug prices often vary depending on where the prescription is filled and some pharmacies may offer cheaper prices.  The website www.goodrx.com contains coupons for medications through different pharmacies. The prices here do not account for what the cost may be with help from insurance (it may be cheaper with your insurance), but the website can give you the price if you did not use any insurance.  - You can print the associated coupon and take it with   your prescription to the pharmacy.  - You may also stop by our office during regular business hours and pick up a GoodRx coupon card.  - If you need your prescription sent electronically to a different pharmacy, notify our office through [facility] MyChart or by phone at [phone] option 4.     Si Usted Necesita Algo Despus de Su Visita  Tambin puede enviarnos un mensaje a travs de MyChart. Por lo general respondemos a los mensajes de MyChart en el transcurso de 1 a 2  das hbiles.  Para renovar recetas, por favor pida a su farmacia que se ponga en contacto con nuestra oficina. Nuestro nmero de fax es el [phone].  Si tiene un asunto urgente cuando la clnica est cerrada y que no puede esperar hasta el siguiente da hbil, puede llamar/localizar a su doctor(a) al nmero que aparece a continuacin.   Por favor, tenga en cuenta que aunque hacemos todo lo posible para estar disponibles para asuntos urgentes fuera del horario de oficina, no estamos disponibles las 24 horas del da, los 7 das de la semana.   Si tiene un problema urgente y no puede comunicarse con nosotros, puede optar por buscar atencin mdica  en el consultorio de su doctor(a), en una clnica privada, en un centro de atencin urgente o en una sala de emergencias.  Si tiene una emergencia mdica, por favor llame inmediatamente al 911 o vaya a la sala de emergencias.  Nmeros de bper  - Dr. Kowalski: [phone]  - Dra. Moye: [phone]  - Dra. Stewart: [phone]  En caso de inclemencias del tiempo, por favor llame a nuestra lnea principal al [phone] para una actualizacin sobre el estado de cualquier retraso o cierre.  Consejos para la medicacin en dermatologa: Por favor, guarde las cajas en las que vienen los medicamentos de uso tpico para ayudarle a seguir las instrucciones sobre dnde y cmo usarlos. Las farmacias generalmente imprimen las instrucciones del medicamento slo en las cajas y no directamente en los tubos del medicamento.   Si su medicamento es muy caro, por favor, pngase en contacto con nuestra oficina llamando al [phone] y presione la opcin 4 o envenos un mensaje a travs de MyChart.   No podemos decirle cul ser su copago por los medicamentos por adelantado ya que esto es diferente dependiendo de la cobertura de su seguro. Sin embargo, es posible que podamos encontrar un medicamento sustituto a menor costo o llenar un formulario para que el  seguro cubra el medicamento que se considera necesario.   Si se requiere una autorizacin previa para que su compaa de seguros cubra su medicamento, por favor permtanos de 1 a 2 das hbiles para completar este proceso.  Los precios de los medicamentos varan con frecuencia dependiendo del lugar de dnde se surte la receta y alguna farmacias pueden ofrecer precios ms baratos.  El sitio web www.goodrx.com tiene cupones para medicamentos de diferentes farmacias. Los precios aqu no tienen en cuenta lo que podra costar con la ayuda del seguro (puede ser ms barato con su seguro), pero el sitio web puede darle el precio si no utiliz ningn seguro.  - Puede imprimir el cupn correspondiente y llevarlo con su receta a la farmacia.  - Tambin puede pasar por nuestra oficina durante el horario de atencin regular y recoger una tarjeta de cupones de GoodRx.  - Si necesita que su receta se enve electrnicamente a una farmacia diferente, informe a nuestra oficina a travs de MyChart de    o por telfono llamando al [phone] y presione la opcin 4.  

## 2022-12-07 ENCOUNTER — Encounter: Payer: Self-pay | Admitting: Dermatology

## 2023-12-09 ENCOUNTER — Ambulatory Visit: Payer: Managed Care, Other (non HMO) | Admitting: Dermatology

## 2023-12-09 ENCOUNTER — Encounter: Payer: Self-pay | Admitting: Dermatology

## 2023-12-09 DIAGNOSIS — Z79899 Other long term (current) drug therapy: Secondary | ICD-10-CM

## 2023-12-09 DIAGNOSIS — L814 Other melanin hyperpigmentation: Secondary | ICD-10-CM

## 2023-12-09 DIAGNOSIS — L821 Other seborrheic keratosis: Secondary | ICD-10-CM

## 2023-12-09 DIAGNOSIS — Z1283 Encounter for screening for malignant neoplasm of skin: Secondary | ICD-10-CM | POA: Diagnosis not present

## 2023-12-09 DIAGNOSIS — L578 Other skin changes due to chronic exposure to nonionizing radiation: Secondary | ICD-10-CM | POA: Diagnosis not present

## 2023-12-09 DIAGNOSIS — L230 Allergic contact dermatitis due to metals: Secondary | ICD-10-CM

## 2023-12-09 DIAGNOSIS — W908XXA Exposure to other nonionizing radiation, initial encounter: Secondary | ICD-10-CM

## 2023-12-09 DIAGNOSIS — L219 Seborrheic dermatitis, unspecified: Secondary | ICD-10-CM

## 2023-12-09 DIAGNOSIS — D1801 Hemangioma of skin and subcutaneous tissue: Secondary | ICD-10-CM

## 2023-12-09 DIAGNOSIS — D229 Melanocytic nevi, unspecified: Secondary | ICD-10-CM

## 2023-12-09 DIAGNOSIS — Z7189 Other specified counseling: Secondary | ICD-10-CM

## 2023-12-09 DIAGNOSIS — Z86018 Personal history of other benign neoplasm: Secondary | ICD-10-CM

## 2023-12-09 MED ORDER — MUPIROCIN 2 % EX OINT: 1.0000 | TOPICAL_OINTMENT | Freq: Every day | CUTANEOUS | 11 refills | Status: AC

## 2023-12-09 MED ORDER — KETOCONAZOLE 2 % EX CREA: 1.0000 | TOPICAL_CREAM | CUTANEOUS | 11 refills | Status: AC

## 2023-12-09 NOTE — Patient Instructions (Signed)

## 2023-12-09 NOTE — Progress Notes (Signed)
 Follow-Up Visit   Subjective  Billy Foster is a 63 y.o. male who presents for the following: Skin Cancer Screening and Full Body Skin Exam The patient presents for Total-Body Skin Exam (TBSE) for skin cancer screening and mole check. The patient has spots, moles and lesions to be evaluated, some may be new or changing and the patient may have concern these could be cancer.  The following portions of the chart were reviewed this encounter and updated as appropriate: medications, allergies, medical history  Review of Systems:  No other skin or systemic complaints except as noted in HPI or Assessment and Plan.  Objective  Well appearing patient in no apparent distress; mood and affect are within normal limits.  A full examination was performed including scalp, head, eyes, ears, nose, lips, neck, chest, axillae, abdomen, back, buttocks, bilateral upper extremities, bilateral lower extremities, hands, feet, fingers, toes, fingernails, and toenails. All findings within normal limits unless otherwise noted below.   Relevant physical exam findings are noted in the Assessment and Plan.   Assessment & Plan   SKIN CANCER SCREENING PERFORMED TODAY.  ACTINIC DAMAGE - Chronic condition, secondary to cumulative UV/sun exposure - diffuse scaly erythematous macules with underlying dyspigmentation - Recommend daily broad spectrum sunscreen SPF 30+ to sun-exposed areas, reapply every 2 hours as needed.  - Staying in the shade or wearing long sleeves, sun glasses (UVA+UVB protection) and wide brim hats (4-inch brim around the entire circumference of the hat) are also recommended for sun protection.  - Call for new or changing lesions.  LENTIGINES, SEBORRHEIC KERATOSES, HEMANGIOMAS - Benign normal skin lesions - Benign-appearing - Call for any changes  MELANOCYTIC NEVI - Tan-brown and/or pink-flesh-colored symmetric macules and papules - Benign appearing on exam today - Observation - Call  clinic for new or changing moles - Recommend daily use of broad spectrum spf 30+ sunscreen to sun-exposed areas.   HISTORY OF DYSPLASTIC NEVUS No evidence of recurrence today Recommend regular full body skin exams Recommend daily broad spectrum sunscreen SPF 30+ to sun-exposed areas, reapply every 2 hours as needed.  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 Condition is symptomatic/ bothersome to patient. Not currently at goal.  Seborrheic Dermatitis is a chronic persistent rash characterized by pinkness and scaling most commonly of the mid face but also can occur on the scalp (dandruff), ears; mid chest, mid back and groin.  It tends to be exacerbated by stress and cooler weather.  People who have neurologic disease may experience new onset or exacerbation of existing seborrheic dermatitis.  The condition is not curable but treatable and can be controlled. Treatment Plan: Start Ketoconazole 2% cr 3-5d/wk  Long term medication management.  Patient is using long term (months to years) prescription medication  to control their dermatologic condition.  These medications require periodic monitoring to evaluate for efficacy and side effects and may require periodic laboratory monitoring.  Return in about 1 year (around 12/08/2024) for TBSE, Hx of Dysplastic nevi.  I, Ardis Rowan, RMA, am acting as scribe for Armida Sans, MD .   Documentation: I have reviewed the above documentation  for accuracy and completeness, and I agree with the above.  Celine Collard, MD

## 2024-03-23 ENCOUNTER — Ambulatory Visit: Admitting: Dermatology

## 2024-03-23 DIAGNOSIS — D489 Neoplasm of uncertain behavior, unspecified: Secondary | ICD-10-CM | POA: Diagnosis not present

## 2024-03-23 DIAGNOSIS — L578 Other skin changes due to chronic exposure to nonionizing radiation: Secondary | ICD-10-CM | POA: Diagnosis not present

## 2024-03-23 DIAGNOSIS — Z7189 Other specified counseling: Secondary | ICD-10-CM | POA: Diagnosis not present

## 2024-03-23 DIAGNOSIS — W908XXA Exposure to other nonionizing radiation, initial encounter: Secondary | ICD-10-CM | POA: Diagnosis not present

## 2024-03-23 DIAGNOSIS — L570 Actinic keratosis: Secondary | ICD-10-CM

## 2024-03-23 NOTE — Progress Notes (Signed)
   Follow-Up Visit   Subjective  Billy Foster is a 63 y.o. male who presents for the following: spot at nose that was treated by his primary doctor with Ln2 was told to follow up with dermatologist if didn't completely go away. Also reports pain when pressing on area.  The patient has spots, moles and lesions to be evaluated, some may be new or changing and the patient may have concern these could be cancer.  The following portions of the chart were reviewed this encounter and updated as appropriate: medications, allergies, medical history  Review of Systems:  No other skin or systemic complaints except as noted in HPI or Assessment and Plan.  Objective  Well appearing patient in no apparent distress; mood and affect are within normal limits.  A focused examination was performed of the following areas: Nose  Relevant exam findings are noted in the Assessment and Plan.  nasal tip 0.6 cm pink papule    Assessment & Plan   NEOPLASM OF UNCERTAIN BEHAVIOR nasal tip Epidermal / dermal shaving  Lesion diameter (cm):  0.6 Informed consent: discussed and consent obtained   Timeout: patient name, date of birth, surgical site, and procedure verified   Procedure prep:  Patient was prepped and draped in usual sterile fashion Prep type:  Isopropyl alcohol Anesthesia: the lesion was anesthetized in a standard fashion   Anesthetic:  1% lidocaine  w/ epinephrine 1-100,000 buffered w/ 8.4% NaHCO3 Instrument used: flexible razor blade   Hemostasis achieved with: pressure, aluminum chloride and electrodesiccation   Outcome: patient tolerated procedure well   Post-procedure details: sterile dressing applied and wound care instructions given   Dressing type: bandage and petrolatum    Destruction of lesion Complexity: extensive   Destruction method: electrodesiccation and curettage   Informed consent: discussed and consent obtained   Timeout:  patient name, date of birth, surgical site, and  procedure verified Procedure prep:  Patient was prepped and draped in usual sterile fashion Prep type:  Isopropyl alcohol Anesthesia: the lesion was anesthetized in a standard fashion   Anesthetic:  1% lidocaine  w/ epinephrine 1-100,000 buffered w/ 8.4% NaHCO3 Curettage performed in three different directions: Yes   Electrodesiccation performed over the curetted area: Yes   Lesion length (cm):  0.6 Lesion width (cm):  0.6 Margin per side (cm):  0.2 Final wound size (cm):  1 Hemostasis achieved with:  pressure, aluminum chloride and electrodesiccation Outcome: patient tolerated procedure well with no complications   Post-procedure details: sterile dressing applied and wound care instructions given   Dressing type: bandage and petrolatum    Specimen 1 - Surgical pathology Differential Diagnosis: r/o bcc vs sebaceous hyperplasia vs sk ED&C done today  Check Margins: No  ACTINIC SKIN DAMAGE    ACTINIC DAMAGE - chronic, secondary to cumulative UV radiation exposure/sun exposure over time - diffuse scaly erythematous macules with underlying dyspigmentation - Recommend daily broad spectrum sunscreen SPF 30+ to sun-exposed areas, reapply every 2 hours as needed.  - Recommend staying in the shade or wearing long sleeves, sun glasses (UVA+UVB protection) and wide brim hats (4-inch brim around the entire circumference of the hat). - Call for new or changing lesions.   Return for keep follow up as scheduled.  IEleanor Blush, CMA, am acting as scribe for Alm Rhyme, MD.   Documentation: I have reviewed the above documentation for accuracy and completeness, and I agree with the above.  Alm Rhyme, MD

## 2024-03-23 NOTE — Patient Instructions (Addendum)
 Biopsy Wound Care Instructions  Leave the original bandage on for 24 hours if possible.  If the bandage becomes soaked or soiled before that time, it is OK to remove it and examine the wound.  A small amount of post-operative bleeding is normal.  If excessive bleeding occurs, remove the bandage, place gauze over the site and apply continuous pressure (no peeking) over the area for 30 minutes. If this does not work, please call our clinic as soon as possible or page your doctor if it is after hours.   Once a day, cleanse the wound with soap and water. It is fine to shower. If a thick crust develops you may use a Q-tip dipped into dilute hydrogen peroxide (mix 1:1 with water) to dissolve it.  Hydrogen peroxide can slow the healing process, so use it only as needed.    After washing, apply petroleum jelly (Vaseline) or an antibiotic ointment if your doctor prescribed one for you, followed by a bandage.    For best healing, the wound should be covered with a layer of ointment at all times. If you are not able to keep the area covered with a bandage to hold the ointment in place, this may mean re-applying the ointment several times a day.  Continue this wound care until the wound has healed and is no longer open.   Itching and mild discomfort is normal during the healing process. However, if you develop pain or severe itching, please call our office.   If you have any discomfort, you can take Tylenol (acetaminophen) or ibuprofen as directed on the bottle. (Please do not take these if you have an allergy to them or cannot take them for another reason).  Some redness, tenderness and white or yellow material in the wound is normal healing.  If the area becomes very sore and red, or develops a thick yellow-green material (pus), it may be infected; please notify us.    If you have stitches, return to clinic as directed to have the stitches removed. You will continue wound care for 2-3 days after the stitches  are removed.   Wound healing continues for up to one year following surgery. It is not unusual to experience pain in the scar from time to time during the interval.  If the pain becomes severe or the scar thickens, you should notify the office.    A slight amount of redness in a scar is expected for the first six months.  After six months, the redness will fade and the scar will soften and fade.  The color difference becomes less noticeable with time.  If there are any problems, return for a post-op surgery check at your earliest convenience.  To improve the appearance of the scar, you can use silicone scar gel, cream, or sheets (such as Mederma or Serica) every night for up to one year. These are available over the counter (without a prescription).  Please call our office at [phone] for any questions or concerns.    Electrodesiccation and Curettage ("Scrape and Burn") Wound Care Instructions  Leave the original bandage on for 24 hours if possible.  If the bandage becomes soaked or soiled before that time, it is OK to remove it and examine the wound.  A small amount of post-operative bleeding is normal.  If excessive bleeding occurs, remove the bandage, place gauze over the site and apply continuous pressure (no peeking) over the area for 30 minutes. If this does not work, please call  our clinic as soon as possible or page your doctor if it is after hours.   Once a day, cleanse the wound with soap and water. It is fine to shower. If a thick crust develops you may use a Q-tip dipped into dilute hydrogen peroxide (mix 1:1 with water) to dissolve it.  Hydrogen peroxide can slow the healing process, so use it only as needed.    After washing, apply petroleum jelly (Vaseline) or an antibiotic ointment if your doctor prescribed one for you, followed by a bandage.    For best healing, the wound should be covered with a layer of ointment at all times. If you are not able to keep the area covered  with a bandage to hold the ointment in place, this may mean re-applying the ointment several times a day.  Continue this wound care until the wound has healed and is no longer open. It may take several weeks for the wound to heal and close.  Itching and mild discomfort is normal during the healing process.  If you have any discomfort, you can take Tylenol (acetaminophen) or ibuprofen as directed on the bottle. (Please do not take these if you have an allergy to them or cannot take them for another reason).  Some redness, tenderness and white or yellow material in the wound is normal healing.  If the area becomes very sore and red, or develops a thick yellow-green material (pus), it may be infected; please notify us.    Wound healing continues for up to one year following surgery. It is not unusual to experience pain in the scar from time to time during the interval.  If the pain becomes severe or the scar thickens, you should notify the office.    A slight amount of redness in a scar is expected for the first six months.  After six months, the redness will fade and the scar will soften and fade.  The color difference becomes less noticeable with time.  If there are any problems, return for a post-op surgery check at your earliest convenience.  To improve the appearance of the scar, you can use silicone scar gel, cream, or sheets (such as Mederma or Serica) every night for up to one year. These are available over the counter (without a prescription).  Please call our office at [phone] for any questions or concerns.     Due to recent changes in healthcare laws, you may see results of your pathology and/or laboratory studies on MyChart before the doctors have had a chance to review them. We understand that in some cases there may be results that are confusing or concerning to you. Please understand that not all results are received at the same time and often the doctors may need to interpret  multiple results in order to provide you with the best plan of care or course of treatment. Therefore, we ask that you please give [place] 2 business days to thoroughly review all your results before contacting the office for clarification. Should we see a critical lab result, you will be contacted sooner.   If You Need Anything After Your Visit  If you have any questions or concerns for your doctor, please call our main line at [phone] and press option 4 to reach your doctor's medical assistant. If no one answers, please leave a voicemail as directed and we will return your call as soon as possible. Messages left after 4 pm will be answered the following business day.  You may also send [place] a message via MyChart. We typically respond to MyChart messages within 1-2 business days.  For prescription refills, please ask your pharmacy to contact our office. Our fax number is [phone].  If you have an urgent issue when the clinic is closed that cannot wait until the next business day, you can page your doctor at the number below.    Please note that while we do our best to be available for urgent issues outside of office hours, we are not available 24/7.   If you have an urgent issue and are unable to reach [place], you may choose to seek medical care at your doctor's office, retail clinic, urgent care center, or emergency room.  If you have a medical emergency, please immediately call 911 or go to the emergency department.  Pager Numbers  - Dr. Gwen Pounds: [phone]  - Dr. Roseanne Reno: [phone]  - Dr. Katrinka Blazing: [phone]   In the event of inclement weather, please call our main line at [phone] for an update on the status of any delays or closures.  Dermatology Medication Tips: Please keep the boxes that topical medications come in in order to help keep track of the instructions about where and how to use these. Pharmacies typically print the medication instructions only on the boxes  and not directly on the medication tubes.   If your medication is too expensive, please contact our office at [phone] option 4 or send [place] a message through MyChart.   We are unable to tell what your co-pay for medications will be in advance as this is different depending on your insurance coverage. However, we may be able to find a substitute medication at lower cost or fill out paperwork to get insurance to cover a needed medication.   If a prior authorization is required to get your medication covered by your insurance company, please allow [place] 1-2 business days to complete this process.  Drug prices often vary depending on where the prescription is filled and some pharmacies may offer cheaper prices.  The website www.goodrx.com contains coupons for medications through different pharmacies. The prices here do not account for what the cost may be with help from insurance (it may be cheaper with your insurance), but the website can give you the price if you did not use any insurance.  - You can print the associated coupon and take it with your prescription to the pharmacy.  - You may also stop by our office during regular business hours and pick up a GoodRx coupon card.  - If you need your prescription sent electronically to a different pharmacy, notify our office through [facility] or by phone at [phone] option 4.     Si Usted Necesita Algo Despus de Su Visita  Tambin puede enviarnos un mensaje a travs de Clinical cytogeneticist. Por lo general respondemos a los mensajes de MyChart en el transcurso de 1 a 2 das hbiles.  Para renovar recetas, por favor pida a su farmacia que se ponga en contacto con nuestra oficina. Annie Sable de fax es [place] [phone].  Si tiene un asunto urgente cuando la clnica est cerrada y que no puede esperar hasta el siguiente da hbil, puede llamar/localizar a su doctor(a) al nmero que aparece a continuacin.   Por favor, tenga en cuenta que aunque  hacemos todo lo posible para estar disponibles para asuntos urgentes fuera del horario de [place], no estamos disponibles las 24 horas del da, los 7 [address]  [address] de la [place].  Si tiene un problema urgente y no puede comunicarse con nosotros, puede optar por buscar atencin mdica  en el consultorio de su doctor(a), en una clnica privada, en un centro de atencin urgente o en una sala de emergencias.  Si tiene Engineer, drilling, por favor llame inmediatamente al 911 o vaya a la sala de emergencias.  Nmeros de bper  - Dr. Gwen Pounds: [phone]  - Dra. Roseanne Reno: [phone]  - Dr. Katrinka Blazing: [phone]   En caso de inclemencias del tiempo, por favor llame a Lacy Duverney principal al [phone] para una actualizacin sobre el [place] de cualquier retraso o cierre.  Consejos para la medicacin en dermatologa: Por favor, guarde las cajas en las que vienen los medicamentos de uso tpico para ayudarle a seguir las instrucciones sobre dnde y cmo usarlos. Las farmacias generalmente imprimen las instrucciones del medicamento slo en las cajas y no directamente en los tubos del [place].   Si su medicamento es muy caro, por favor, pngase en contacto con Rolm Gala llamando al [phone] y presione la opcin 4 o envenos un mensaje a travs de Clinical cytogeneticist.   No podemos decirle cul ser su copago por los medicamentos por adelantado ya que esto es diferente dependiendo de la cobertura de su seguro. Sin embargo, es posible que podamos encontrar un medicamento sustituto a Audiological scientist un formulario para que el seguro cubra el medicamento que se considera necesario.   Si se requiere una autorizacin previa para que su compaa de seguros [place] su medicamento, por favor permtanos de 1 a 2 das hbiles para completar [address]  Los precios de los medicamentos varan con frecuencia dependiendo del Environmental consultant de dnde se surte la receta y alguna farmacias pueden ofrecer precios ms  baratos.  El sitio web www.goodrx.com tiene cupones para medicamentos de Health and safety inspector. Los precios aqu no tienen en cuenta lo que podra costar con la ayuda del seguro (puede ser ms barato con su seguro), pero el sitio web puede darle el precio si no utiliz Tourist information centre manager.  - Puede imprimir el cupn correspondiente y llevarlo con su receta a la farmacia.  - Tambin puede pasar por nuestra oficina durante el horario de atencin regular y Education officer, museum una tarjeta de cupones de GoodRx.  - Si necesita que su receta se enve electrnicamente a una farmacia diferente, informe a nuestra oficina a travs de MyChart de [place] o por telfono llamando al [phone] y presione la opcin 4.

## 2024-03-24 ENCOUNTER — Encounter: Payer: Self-pay | Admitting: Dermatology

## 2024-03-29 ENCOUNTER — Ambulatory Visit: Payer: Self-pay | Admitting: Dermatology

## 2024-03-29 LAB — SURGICAL PATHOLOGY

## 2024-03-30 NOTE — Telephone Encounter (Addendum)
 Called and discussed results with patient. He verbalized understanding and denied further questions.  ----- Message from Alm Rhyme sent at 03/29/2024  6:10 PM EDT ----- FINAL DIAGNOSIS        1. Skin, nasal tip :       ACTINIC KERATOSIS   PreCancer  Already treated Recheck next visit  ----- Message ----- From: Interface, Lab In Three Zero One Sent: 03/29/2024   5:15 PM EDT To: Alm JAYSON Rhyme, MD

## 2024-08-31 ENCOUNTER — Encounter: Payer: Self-pay | Admitting: Dermatology

## 2024-08-31 ENCOUNTER — Ambulatory Visit (INDEPENDENT_AMBULATORY_CARE_PROVIDER_SITE_OTHER): Admitting: Dermatology

## 2024-08-31 DIAGNOSIS — L578 Other skin changes due to chronic exposure to nonionizing radiation: Secondary | ICD-10-CM

## 2024-08-31 DIAGNOSIS — C44311 Basal cell carcinoma of skin of nose: Secondary | ICD-10-CM

## 2024-08-31 DIAGNOSIS — Z7189 Other specified counseling: Secondary | ICD-10-CM

## 2024-08-31 DIAGNOSIS — W908XXA Exposure to other nonionizing radiation, initial encounter: Secondary | ICD-10-CM | POA: Diagnosis not present

## 2024-08-31 DIAGNOSIS — D489 Neoplasm of uncertain behavior, unspecified: Secondary | ICD-10-CM

## 2024-08-31 DIAGNOSIS — C4491 Basal cell carcinoma of skin, unspecified: Secondary | ICD-10-CM

## 2024-08-31 HISTORY — DX: Basal cell carcinoma of skin, unspecified: C44.91

## 2024-08-31 NOTE — Progress Notes (Signed)
" ° °  Follow-Up Visit   Subjective  Billy Foster is a 64 y.o. male who presents for the following: Here concerning bx proven ak at nasal tip, states still has not healed or completely went away and is concerned.  Originally, on 03/23/2024 the left nose supratip area biopsy-proven AK area was treated with shave removal and EDC.   FINAL DIAGNOSIS        1. Skin, nasal tip :       ACTINIC KERATOSIS    PreCancer  Already treated Recheck next visit  The patient has spots, moles and lesions to be evaluated, some may be new or changing and the patient may have concern these could be cancer.  The following portions of the chart were reviewed this encounter and updated as appropriate: medications, allergies, medical history  Review of Systems:  No other skin or systemic complaints except as noted in HPI or Assessment and Plan.  Objective  Well appearing patient in no apparent distress; mood and affect are within normal limits.  A focused examination was performed of the following areas: Nasal tip  Relevant exam findings are noted in the Assessment and Plan.  nasal tip Crusted papule at nose   Assessment & Plan    ACTINIC DAMAGE - chronic, secondary to cumulative UV radiation exposure/sun exposure over time - diffuse scaly erythematous macules with underlying dyspigmentation - Recommend daily broad spectrum sunscreen SPF 30+ to sun-exposed areas, reapply every 2 hours as needed.  - Recommend staying in the shade or wearing long sleeves, sun glasses (UVA+UVB protection) and wide brim hats (4-inch brim around the entire circumference of the hat). - Call for new or changing lesions.  NEOPLASM OF UNCERTAIN BEHAVIOR nasal tip - Skin / nail biopsy Type of biopsy: tangential   Informed consent: discussed and consent obtained   Timeout: patient name, date of birth, surgical site, and procedure verified   Procedure prep:  Patient was prepped and draped in usual sterile fashion Prep  type:  Isopropyl alcohol Anesthesia: the lesion was anesthetized in a standard fashion   Anesthetic:  1% lidocaine  w/ epinephrine 1-100,000 buffered w/ 8.4% NaHCO3 Instrument used: flexible razor blade   Hemostasis achieved with: pressure, aluminum chloride and electrodesiccation   Outcome: patient tolerated procedure well   Post-procedure details: sterile dressing applied and wound care instructions given   Dressing type: petrolatum and bandage    Specimen 1 - Surgical pathology Differential Diagnosis: r/o BCC  Refer to previous pathology from 03/23/2024 Accession: [id number] Persistent scab Check Margins: yes See previous pathology 03/23/2024 Accession: [id number] Hx of bx proven ak at nasal tip treated with [facility] Copy of Microscopic Description from previous pathology The epidermis exhibits partial thickness keratinocytic atypia and parakeratosis. There is solar elastosis in the dermis. This is an actinic keratosis. Multiple levels taken through the submitted block are examined. Eleanor Age MD Dermatopathologist, Electronic Signature (Case signed 03/29/2024   Evaluated under dermatoscope an noted area of suspicion at 1' oclock at edge of crust Dr. Raymund also evaluated and agrees  Recommend bx to further determine and r/o [facility]   Return for keep follow up as scheduled.  IEleanor Blush, CMA, am acting as scribe for Alm Rhyme, MD.   Documentation: I have reviewed the above documentation for accuracy and completeness, and I agree with the above.  Alm Rhyme, MD    "

## 2024-08-31 NOTE — Patient Instructions (Addendum)
 Biopsy Wound Care Instructions  Leave the original bandage on for 24 hours if possible.  If the bandage becomes soaked or soiled before that time, it is OK to remove it and examine the wound.  A small amount of post-operative bleeding is normal.  If excessive bleeding occurs, remove the bandage, place gauze over the site and apply continuous pressure (no peeking) over the area for 30 minutes. If this does not work, please call our clinic as soon as possible or page your doctor if it is after hours.   Once a day, cleanse the wound with soap and water. It is fine to shower. If a thick crust develops you may use a Q-tip dipped into dilute hydrogen peroxide (mix 1:1 with water) to dissolve it.  Hydrogen peroxide can slow the healing process, so use it only as needed.    After washing, apply petroleum jelly (Vaseline) or an antibiotic ointment if your doctor prescribed one for you, followed by a bandage.    For best healing, the wound should be covered with a layer of ointment at all times. If you are not able to keep the area covered with a bandage to hold the ointment in place, this may mean re-applying the ointment several times a day.  Continue this wound care until the wound has healed and is no longer open.   Itching and mild discomfort is normal during the healing process. However, if you develop pain or severe itching, please call our office.   If you have any discomfort, you can take Tylenol  (acetaminophen ) or ibuprofen as directed on the bottle. (Please do not take these if you have an allergy to them or cannot take them for another reason).  Some redness, tenderness and white or yellow material in the wound is normal healing.  If the area becomes very sore and red, or develops a thick yellow-green material (pus), it may be infected; please notify us .    If you have stitches, return to clinic as directed to have the stitches removed. You will continue wound care for 2-3 days after the stitches  are removed.   Wound healing continues for up to one year following surgery. It is not unusual to experience pain in the scar from time to time during the interval.  If the pain becomes severe or the scar thickens, you should notify the office.    A slight amount of redness in a scar is expected for the first six months.  After six months, the redness will fade and the scar will soften and fade.  The color difference becomes less noticeable with time.  If there are any problems, return for a post-op surgery check at your earliest convenience.  To improve the appearance of the scar, you can use silicone scar gel, cream, or sheets (such as Mederma or Serica) every night for up to one year. These are available over the counter (without a prescription).  Please call our office at [phone] for any questions or concerns.      Due to recent changes in healthcare laws, you may see results of your pathology and/or laboratory studies on MyChart before the doctors have had a chance to review them. We understand that in some cases there may be results that are confusing or concerning to you. Please understand that not all results are received at the same time and often the doctors may need to interpret multiple results in order to provide you with the best plan of care or  course of treatment. Therefore, we ask that you please give us  2 business days to thoroughly review all your results before contacting the office for clarification. Should we see a critical lab result, you will be contacted sooner.   If You Need Anything After Your Visit  If you have any questions or concerns for your doctor, please call our main line at [phone] and press option 4 to reach your doctor's medical assistant. If no one answers, please leave a voicemail as directed and we will return your call as soon as possible. Messages left after 4 pm will be answered the following business day.   You may also send us  a message  via MyChart. We typically respond to MyChart messages within 1-2 business days.  For prescription refills, please ask your pharmacy to contact our office. Our fax number is [phone].  If you have an urgent issue when the clinic is closed that cannot wait until the next business day, you can page your doctor at the number below.    Please note that while we do our best to be available for urgent issues outside of office hours, we are not available 24/7.   If you have an urgent issue and are unable to reach us , you may choose to seek medical care at your doctor's office, retail clinic, urgent care center, or emergency room.  If you have a medical emergency, please immediately call 911 or go to the emergency department.  Pager Numbers  - Dr. Hester: [phone]  - Dr. Jackquline: [phone]  - Dr. Claudene: [phone]   - Dr. Raymund: [phone]  In the event of inclement weather, please call our main line at [phone] for an update on the status of any delays or closures.  Dermatology Medication Tips: Please keep the boxes that topical medications come in in order to help keep track of the instructions about where and how to use these. Pharmacies typically print the medication instructions only on the boxes and not directly on the medication tubes.   If your medication is too expensive, please contact our office at [phone] option 4 or send us  a message through MyChart.   We are unable to tell what your co-pay for medications will be in advance as this is different depending on your insurance coverage. However, we may be able to find a substitute medication at lower cost or fill out paperwork to get insurance to cover a needed medication.   If a prior authorization is required to get your medication covered by your insurance company, please allow us  1-2 business days to complete this process.  Drug prices often vary depending on where the prescription is filled and some  pharmacies may offer cheaper prices.  The website www.goodrx.com contains coupons for medications through different pharmacies. The prices here do not account for what the cost may be with help from insurance (it may be cheaper with your insurance), but the website can give you the price if you did not use any insurance.  - You can print the associated coupon and take it with your prescription to the pharmacy.  - You may also stop by our office during regular business hours and pick up a GoodRx coupon card.  - If you need your prescription sent electronically to a different pharmacy, notify our office through [facility] or by phone at [phone] option 4.     Si Usted Necesita Algo Despus de Su Visita  Tambin puede enviarnos un mensaje a travs de  MyChart. Por lo general respondemos a los mensajes de MyChart en el transcurso de 1 a 2 das hbiles.  Para renovar recetas, por favor pida a su farmacia que se ponga en contacto con nuestra oficina. Randi lakes de fax es [place] [phone].  Si tiene un asunto urgente cuando la clnica est cerrada y que no puede esperar hasta el siguiente da hbil, puede llamar/localizar a su doctor(a) al nmero que aparece a continuacin.   Por favor, tenga en cuenta que aunque hacemos todo lo posible para estar disponibles para asuntos urgentes fuera del horario de [place], no estamos disponibles las 24 horas del da, los 7 [address]  [address] de la [place].   Si tiene un problema urgente y no puede comunicarse con nosotros, puede optar por buscar atencin mdica  en el consultorio de su doctor(a), en una clnica privada, en un centro de atencin urgente o en una sala de emergencias.  Si tiene Engineer, drilling, por favor llame inmediatamente al 911 o vaya a la sala de emergencias.  Nmeros de bper  - Dr. Hester: [phone]  - Dra. Jackquline: [phone]  - Dr. Claudene: [phone]  - Dra. Kitts: [phone]  En caso de inclemencias del [place],  por favor llame a nuestra lnea principal al [phone] para una actualizacin sobre el estado de cualquier retraso o cierre.  Consejos para la medicacin en dermatologa: Por favor, guarde las cajas en las que vienen los medicamentos de uso tpico para ayudarle a seguir las instrucciones sobre dnde y cmo usarlos. Las farmacias generalmente imprimen las instrucciones del medicamento slo en las cajas y no directamente en los tubos del [place].   Si su medicamento es muy caro, por favor, pngase en contacto con landry rieger llamando al [phone] y presione la opcin 4 o envenos un mensaje a travs de Clinical cytogeneticist.   No podemos decirle cul ser su copago por los medicamentos por adelantado ya que esto es diferente dependiendo de la cobertura de su seguro. Sin embargo, es posible que podamos encontrar un medicamento sustituto a Audiological scientist un formulario para que el seguro cubra el medicamento que se considera necesario.   Si se requiere una autorizacin previa para que su compaa de seguros malta su medicamento, por favor permtanos de 1 a 2 das hbiles para completar este proceso.  Los precios de los medicamentos varan con frecuencia dependiendo del Environmental consultant de dnde se surte la receta y alguna farmacias pueden ofrecer precios ms baratos.  El sitio web www.goodrx.com tiene cupones para medicamentos de Health and safety inspector. Los precios aqu no tienen en cuenta lo que podra costar con la ayuda del seguro (puede ser ms barato con su seguro), pero el sitio web puede darle el precio si no utiliz Tourist information centre manager.  - Puede imprimir el cupn correspondiente y llevarlo con su receta a la farmacia.  - Tambin puede pasar por nuestra oficina durante el horario de atencin regular y Education officer, museum una tarjeta de cupones de GoodRx.  - Si necesita que su receta se enve electrnicamente a una farmacia diferente, informe a nuestra oficina a travs de MyChart de [place] o por telfono llamando al  [phone] y presione la opcin 4.

## 2024-09-01 LAB — SURGICAL PATHOLOGY

## 2024-09-05 ENCOUNTER — Ambulatory Visit: Payer: Self-pay | Admitting: Dermatology

## 2024-09-05 DIAGNOSIS — C44311 Basal cell carcinoma of skin of nose: Secondary | ICD-10-CM

## 2024-09-06 ENCOUNTER — Encounter: Payer: Self-pay | Admitting: Dermatology

## 2024-09-06 NOTE — Addendum Note (Signed)
 Addended by: TANDA SETTER A on: 09/06/2024 05:10 PM   Modules accepted: Orders

## 2024-09-06 NOTE — Telephone Encounter (Addendum)
 Tried calling patient regarding results. No answer. Unable to leave message will try again   ----- Message from Alm Rhyme, MD sent at 09/05/2024  5:11 PM EST ----- FINAL DIAGNOSIS        1. Skin, nasal tip :       BASAL CELL CARCINOMA, NODULAR PATTERN, ULCERATED    Cancer = BCC Schedule for MOHS (Dr Bluford at [facility])

## 2024-09-06 NOTE — Telephone Encounter (Addendum)
 Called and discussed results with patient. He verbalized understanding. Referral placed to Dr. Bluford office at [facility]  Will recheck at next follow up  ----- Message from Alm Rhyme, MD sent at 09/05/2024  5:11 PM EST ----- FINAL DIAGNOSIS        1. Skin, nasal tip :       BASAL CELL CARCINOMA, NODULAR PATTERN, ULCERATED    Cancer = BCC Schedule for MOHS (Dr Bluford at [facility])

## 2024-09-15 NOTE — Telephone Encounter (Addendum)
 Patient scheduled for Mohs with Dr. Bluford on 12/08/2024 at 7:45 am   ----- Message from Alm Rhyme, MD sent at 09/05/2024  5:11 PM EST ----- FINAL DIAGNOSIS        1. Skin, nasal tip :       BASAL CELL CARCINOMA, NODULAR PATTERN, ULCERATED    Cancer = BCC Schedule for MOHS (Dr Bluford at [facility])

## 2024-12-14 ENCOUNTER — Ambulatory Visit: Admitting: Dermatology
